# Patient Record
Sex: Female | Born: 1965
Health system: Southern US, Community
[De-identification: ages and names within clinical notes are randomized; demographics above are authoritative.]

## PROBLEM LIST (undated history)

## (undated) DIAGNOSIS — T8859XA Other complications of anesthesia, initial encounter: Secondary | ICD-10-CM

## (undated) DIAGNOSIS — Z5189 Encounter for other specified aftercare: Secondary | ICD-10-CM

## (undated) DIAGNOSIS — D649 Anemia, unspecified: Secondary | ICD-10-CM

## (undated) DIAGNOSIS — M1990 Unspecified osteoarthritis, unspecified site: Secondary | ICD-10-CM

## (undated) DIAGNOSIS — T4145XA Adverse effect of unspecified anesthetic, initial encounter: Secondary | ICD-10-CM

## (undated) DIAGNOSIS — E119 Type 2 diabetes mellitus without complications: Secondary | ICD-10-CM

## (undated) DIAGNOSIS — I10 Essential (primary) hypertension: Secondary | ICD-10-CM

## (undated) HISTORY — DX: Encounter for other specified aftercare: Z51.89

## (undated) HISTORY — DX: Unspecified osteoarthritis, unspecified site: M19.90

## (undated) HISTORY — DX: Essential (primary) hypertension: I10

## (undated) HISTORY — PX: COLONOSCOPY: SHX174

## (undated) HISTORY — DX: Type 2 diabetes mellitus without complications: E11.9

## (undated) HISTORY — PX: TUBAL LIGATION: SHX77

---
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
## 2010-05-31 DIAGNOSIS — Z5189 Encounter for other specified aftercare: Secondary | ICD-10-CM

## 2010-05-31 HISTORY — DX: Encounter for other specified aftercare: Z51.89

## 2010-12-17 ENCOUNTER — Other Ambulatory Visit: Payer: Self-pay

## 2010-12-17 ENCOUNTER — Emergency Department (HOSPITAL_BASED_OUTPATIENT_CLINIC_OR_DEPARTMENT_OTHER)
Admission: EM | Admit: 2010-12-17 | Discharge: 2010-12-17 | Disposition: A | Discharge status: Other Acute Inpatient Hospital | Payer: Self-pay | Attending: Emergency Medicine | Admitting: Emergency Medicine

## 2010-12-17 ENCOUNTER — Encounter: Payer: Self-pay | Admitting: *Deleted

## 2010-12-17 DIAGNOSIS — R404 Transient alteration of awareness: Secondary | ICD-10-CM | POA: Insufficient documentation

## 2010-12-17 DIAGNOSIS — R55 Syncope and collapse: Secondary | ICD-10-CM | POA: Insufficient documentation

## 2010-12-17 DIAGNOSIS — D649 Anemia, unspecified: Secondary | ICD-10-CM | POA: Insufficient documentation

## 2010-12-17 HISTORY — DX: Anemia, unspecified: D64.9

## 2010-12-17 LAB — COMPREHENSIVE METABOLIC PANEL
Alkaline Phosphatase: 51 U/L (ref 39–117)
BUN: 10 mg/dL (ref 6–23)
CO2: 24 mEq/L (ref 19–32)
Chloride: 101 mEq/L (ref 96–112)
GFR calc Af Amer: >60 mL/min (ref >60)
GFR calc non Af Amer: >60 mL/min (ref >60)
Glucose, Bld: 120 mg/dL — ABNORMAL HIGH (ref 70–99)
Potassium: 3.5 mEq/L (ref 3.5–5.1)
Total Bilirubin: 0.2 mg/dL — ABNORMAL LOW (ref 0.3–1.2)

## 2010-12-17 LAB — DIFFERENTIAL
Basophils Relative: 0 % (ref 0–1)
Lymphocytes Relative: 17 % (ref 12–46)
Monocytes Absolute: 0.5 10*3/uL (ref 0.1–1.0)
Monocytes Relative: 7 % (ref 3–12)
Neutro Abs: 4.7 10*3/uL (ref 1.7–7.7)

## 2010-12-17 LAB — LIPASE, BLOOD: Lipase: 36 U/L (ref 11–59)

## 2010-12-17 LAB — CBC
HCT: 21.1 % — ABNORMAL LOW (ref 36.0–46.0)
Hemoglobin: 5.9 g/dL — CL (ref 12.0–15.0)
MCHC: 28 g/dL — ABNORMAL LOW (ref 30.0–36.0)

## 2010-12-17 NOTE — ED Notes (Signed)
Pt admitted to [facility] 767 IV saline lock intact.

## 2010-12-17 NOTE — ED Provider Notes (Signed)
History     Chief Complaint  Patient presents with  . Loss of Consciousness   HPI Comments: Patient states was walking in [place], developed pain in the epigastric area, felt like was going to pass out.  Improving now.  Had just eaten a fish dinner prior to the onset of symptoms.  Patient is a 45 y.o. female presenting with syncope. The history is provided by the patient.  Loss of Consciousness This is a new problem. The current episode started 1 to 2 hours ago. The problem has been rapidly improving. Associated symptoms include abdominal pain. Pertinent negatives include no chest pain and no shortness of breath. The symptoms are aggravated by nothing. The symptoms are relieved by rest. She has tried nothing for the symptoms.    Past Medical History  Diagnosis Date  . Anemia     History reviewed. No pertinent past surgical history.  No family history on file.  History  Substance Use Topics  . Smoking status: Never Smoker   . Smokeless tobacco: Not on file  . Alcohol Use: No    OB History    Grav Para Term Preterm Abortions TAB SAB Ect Mult Living                  Review of Systems  Constitutional: Negative for fever, chills and diaphoresis.  Respiratory: Negative for shortness of breath.   Cardiovascular: Positive for syncope. Negative for chest pain.  Gastrointestinal: Positive for abdominal pain.  Neurological: Positive for dizziness.  All other systems reviewed and are negative.    Physical Exam  BP 119/69  Pulse 64  Temp(Src) 98.5 F (36.9 C) (Oral)  Resp 20  SpO2 100%  Physical Exam  Constitutional: She is oriented to person, place, and time. She appears well-developed and well-nourished. No distress.  HENT:  Head: Normocephalic and atraumatic.  Eyes: EOM are normal. Pupils are equal, round, and reactive to light. Right eye exhibits no discharge.  Neck: Normal range of motion. Neck supple.  Cardiovascular: Normal rate, regular rhythm and normal heart  sounds.   Pulmonary/Chest: Effort normal and breath sounds normal. No respiratory distress. She has no wheezes. She has no rales.  Abdominal: Soft. Bowel sounds are normal. She exhibits no distension. There is no tenderness.  Musculoskeletal: Normal range of motion.  Neurological: She is alert and oriented to person, place, and time.  Skin: Skin is warm and dry. She is not diaphoretic.    ED Course  Procedures  MDM Rectal exam performed.  There was blood from period which skews the results.  Spoke with the hospitalist at [facility].  Will accept in transfer.      Riley Lam [facility] 12/22/10 [phone]

## 2010-12-17 NOTE — ED Notes (Signed)
States she went to [employer] after eating lunch. C.o abd pain sudden onset and passed out. Hx Chronic anemia. No vomiting. Weakness on arrival to triage.

## 2011-09-23 ENCOUNTER — Encounter: Payer: Self-pay | Admitting: Family Medicine

## 2011-09-23 ENCOUNTER — Ambulatory Visit (INDEPENDENT_AMBULATORY_CARE_PROVIDER_SITE_OTHER): Payer: BC Managed Care – PPO | Admitting: Family Medicine

## 2011-09-23 VITALS — BP 110/72 | HR 65 | Temp 98.1°F | Ht 68.5 in | Wt 191.0 lb

## 2011-09-23 DIAGNOSIS — N920 Excessive and frequent menstruation with regular cycle: Secondary | ICD-10-CM | POA: Insufficient documentation

## 2011-09-23 DIAGNOSIS — D500 Iron deficiency anemia secondary to blood loss (chronic): Secondary | ICD-10-CM

## 2011-09-23 LAB — CBC WITH DIFFERENTIAL/PLATELET
Basophils Relative: 0.5 % (ref 0.0–3.0)
Eosinophils Absolute: 0 10*3/uL (ref 0.0–0.7)
Hemoglobin: 8.2 g/dL — ABNORMAL LOW (ref 12.0–15.0)
MCHC: 30.5 g/dL (ref 30.0–36.0)
MCV: 72.4 fl — ABNORMAL LOW (ref 78.0–100.0)
Monocytes Absolute: 0.2 10*3/uL (ref 0.1–1.0)
Neutro Abs: 1.6 10*3/uL (ref 1.4–7.7)
RBC: 3.71 Mil/uL — ABNORMAL LOW (ref 3.87–5.11)

## 2011-09-23 NOTE — Patient Instructions (Signed)
We'll notify you of your lab results We'll call you with your GYN appt to discuss your options Call with any questions or concerns Welcome!  We're glad to have you! Happy Spring!!

## 2011-09-23 NOTE — Progress Notes (Signed)
  Subjective:    Patient ID: Carmen Porter, female    DOB: [birth year], 46 y.o.   MRN: [id number]  HPI New.  Previous MD- [facility].  Anemia- pt reports chronic problem, having 'heavy heavy periods'.  Was checked for fibroids but this was 'negative'.  Reports iron levels are low.  Has had 'fainting spells'.  Has had blood transfusions previously.  Does not want hysterectomy.  Has tried NuvaRing which 'helped'.  + fatigue.  LMP- 4/13.   Review of Systems For ROS see HPI     Objective:   Physical Exam  Vitals reviewed. Constitutional: She is oriented to person, place, and time. She appears well-developed and well-nourished. No distress.  HENT:  Head: Normocephalic and atraumatic.  Eyes: Conjunctivae and EOM are normal. Pupils are equal, round, and reactive to light.  Neck: Normal range of motion. Neck supple. No thyromegaly present.  Cardiovascular: Normal rate, regular rhythm, normal heart sounds and intact distal pulses.   No murmur heard. Pulmonary/Chest: Effort normal and breath sounds normal. No respiratory distress.  Abdominal: Soft. She exhibits no distension. There is no tenderness.  Musculoskeletal: She exhibits no edema.  Lymphadenopathy:    She has no cervical adenopathy.  Neurological: She is alert and oriented to person, place, and time.  Skin: Skin is warm and dry.  Psychiatric: She has a normal mood and affect. Her behavior is normal.          Assessment & Plan:

## 2011-09-26 NOTE — Assessment & Plan Note (Signed)
New.  Refer to GYN for discussion on ablation procedure as pt is not interested in hysterectomy.  Will follow closely.

## 2011-09-26 NOTE — Assessment & Plan Note (Signed)
New to provider.  Chronic for pt.  Check labs to assess current levels.  Refer to GYN for discussion on menorrhagia to prevent additional losses.

## 2011-10-01 MED ORDER — FERROUS SULFATE 325 (65 FE) MG PO TABS: 325.0000 mg | ORAL_TABLET | Freq: Every day | ORAL | Status: DC

## 2011-10-01 NOTE — Progress Notes (Signed)
Addended by: Derry Lory A on: 10/01/2011 05:31 PM   Modules accepted: Orders

## 2012-06-15 ENCOUNTER — Ambulatory Visit: Payer: Self-pay | Admitting: Family Medicine

## 2012-06-15 DIAGNOSIS — Z0489 Encounter for examination and observation for other specified reasons: Secondary | ICD-10-CM

## 2012-10-06 ENCOUNTER — Encounter: Payer: Self-pay | Admitting: Family Medicine

## 2012-10-06 ENCOUNTER — Ambulatory Visit (INDEPENDENT_AMBULATORY_CARE_PROVIDER_SITE_OTHER): Payer: BC Managed Care – PPO | Admitting: Family Medicine

## 2012-10-06 VITALS — BP 130/78 | HR 74 | Temp 98.2°F | Ht 67.5 in | Wt 192.6 lb

## 2012-10-06 DIAGNOSIS — M25532 Pain in left wrist: Secondary | ICD-10-CM | POA: Insufficient documentation

## 2012-10-06 DIAGNOSIS — Z1331 Encounter for screening for depression: Secondary | ICD-10-CM

## 2012-10-06 DIAGNOSIS — M25539 Pain in unspecified wrist: Secondary | ICD-10-CM

## 2012-10-06 DIAGNOSIS — Z0000 Encounter for general adult medical examination without abnormal findings: Secondary | ICD-10-CM | POA: Insufficient documentation

## 2012-10-06 DIAGNOSIS — Z1231 Encounter for screening mammogram for malignant neoplasm of breast: Secondary | ICD-10-CM

## 2012-10-06 DIAGNOSIS — E049 Nontoxic goiter, unspecified: Secondary | ICD-10-CM

## 2012-10-06 DIAGNOSIS — E010 Iodine-deficiency related diffuse (endemic) goiter: Secondary | ICD-10-CM

## 2012-10-06 NOTE — Patient Instructions (Addendum)
We'll notify you of your lab results and make any changes if needed Someone will call you with your mammogram and thyroid ultrasound Follow up w/ GYN as scheduled Take time for yourself!  You deserve it! Call with any questions or concerns Happy Mother's Day!

## 2012-10-06 NOTE — Progress Notes (Signed)
  Subjective:    Patient ID: Marzetta Board, female    DOB: June 04, 1965, 47 y.o.   MRN: [id number]  HPI CPE- last mammo 2 yrs ago.  UTD on GYN- [facility]   Review of Systems Patient reports no vision/ hearing changes, adenopathy,fever, weight change,  persistant/recurrent hoarseness , swallowing issues, chest pain, palpitations, edema, persistant/recurrent cough, hemoptysis, dyspnea (rest/exertional/paroxysmal nocturnal), gastrointestinal bleeding (melena, rectal bleeding), abdominal pain, significant heartburn, bowel changes, GU symptoms (dysuria, hematuria, incontinence), Gyn symptoms (abnormal  bleeding, pain),  syncope, focal weakness, memory loss, numbness & tingling, skin/hair/nail changes, abnormal bruising, anxiety, or depression.   L wrist pain- pt had injury at work 1 month ago and now having sharp pains associated w/ weakness.  Pressure of watch is uncomfortable.  Pain w/ wrist flexion, painful to lift.  Taking ibuprofen w/ some relief.    Objective:   Physical Exam General Appearance:    Alert, cooperative, no distress, appears stated age  Head:    Normocephalic, without obvious abnormality, atraumatic  Eyes:    PERRL, conjunctiva/corneas clear, EOM's intact, fundi    benign, both eyes  Ears:    Normal TM's and external ear canals, both ears  Nose:   Nares normal, septum midline, mucosa normal, no drainage    or sinus tenderness  Throat:   Lips, mucosa, and tongue normal; teeth and gums normal  Neck:   Supple, symmetrical, trachea midline, no adenopathy;    Thyroid: R thyroid enlargement w/ ? nodule  Back:     Symmetric, no curvature, ROM normal, no CVA tenderness  Lungs:     Clear to auscultation bilaterally, respirations unlabored  Chest Wall:    No tenderness or deformity   Heart:    Regular rate and rhythm, S1 and S2 normal, no murmur, rub   or gallop  Breast Exam:    Deferred to GYN  Abdomen:     Soft, non-tender, bowel sounds active all four quadrants,    no masses, no  organomegaly  Genitalia:    Deferred to GYN  Rectal:    Extremities:   Extremities normal, atraumatic, no cyanosis or edema.  L wrist TTP on extensor surface, pain w/ wrist flexion.  Pulses:   2+ and symmetric all extremities  Skin:   Skin color, texture, turgor normal, no rashes or lesions  Lymph nodes:   Cervical, supraclavicular, and axillary nodes normal  Neurologic:   CNII-XII intact, normal strength, sensation and reflexes    throughout          Assessment & Plan:

## 2012-10-07 LAB — BASIC METABOLIC PANEL
CO2: 23 mEq/L (ref 19–32)
Chloride: 107 mEq/L (ref 96–112)
Creat: 0.61 mg/dL (ref 0.50–1.10)
Potassium: 4.1 mEq/L (ref 3.5–5.3)

## 2012-10-07 LAB — HEPATIC FUNCTION PANEL
ALT: 12 U/L (ref 0–35)
AST: 20 U/L (ref 0–37)
Albumin: 4.3 g/dL (ref 3.5–5.2)
Alkaline Phosphatase: 52 U/L (ref 39–117)
Total Bilirubin: 0.2 mg/dL — ABNORMAL LOW (ref 0.3–1.2)
Total Protein: 7.4 g/dL (ref 6.0–8.3)

## 2012-10-07 LAB — LIPID PANEL: Cholesterol: 168 mg/dL (ref 0–200)

## 2012-10-07 LAB — CBC WITH DIFFERENTIAL/PLATELET
Basophils Relative: 0 % (ref 0–1)
Eosinophils Absolute: 0.1 10*3/uL (ref 0.0–0.7)
Eosinophils Relative: 1 % (ref 0–5)
Hemoglobin: 7.8 g/dL — ABNORMAL LOW (ref 12.0–15.0)
MCH: 19.5 pg — ABNORMAL LOW (ref 26.0–34.0)
MCHC: 29.9 g/dL — ABNORMAL LOW (ref 30.0–36.0)
MCV: 65.1 fL — ABNORMAL LOW (ref 78.0–100.0)
Monocytes Absolute: 0.2 10*3/uL (ref 0.1–1.0)
Monocytes Relative: 4 % (ref 3–12)
Neutrophils Relative %: 60 % (ref 43–77)

## 2012-10-07 LAB — TSH: TSH: 1.536 u[IU]/mL (ref 0.350–4.500)

## 2012-10-08 NOTE — Assessment & Plan Note (Signed)
New.  Refer to ortho given duration of pain and limited motion.  Pt expressed understanding and is in agreement w/ plan.

## 2012-10-08 NOTE — Assessment & Plan Note (Signed)
New.  Get US and labs to assess. 

## 2012-10-08 NOTE — Assessment & Plan Note (Signed)
Pt due for mammo.  + thyromegaly on PE.  UTD on pap.  Check labs.  Anticipatory guidance provided.

## 2012-10-10 ENCOUNTER — Telehealth: Payer: Self-pay | Admitting: *Deleted

## 2012-10-10 MED ORDER — FERROUS SULFATE 325 (65 FE) MG PO TABS: 325.0000 mg | ORAL_TABLET | Freq: Every day | ORAL | Status: DC

## 2012-10-10 NOTE — Telephone Encounter (Signed)
Spoke with the pt and informed her of recent lab results and recommendations per Dr. Beverely Low.  Pt understood and agreed.   New rx sent to the pharmacy by e-script.//AB/CMA

## 2012-10-10 NOTE — Telephone Encounter (Signed)
Message copied by Verdie Shire on Tue Oct 10, 2012  4:09 PM ------      Message from: Sheliah Hatch      Created: Sun Oct 08, 2012  9:25 AM       Pt continues to be very anemic- needs to start daily FeSO4 325 daily.  Also should add stool softener ------

## 2012-10-11 LAB — VITAMIN D 1,25 DIHYDROXY: Vitamin D2 1, 25 (OH)2: <8 pg/mL

## 2012-10-13 ENCOUNTER — Ambulatory Visit (HOSPITAL_BASED_OUTPATIENT_CLINIC_OR_DEPARTMENT_OTHER)
Admission: RE | Admit: 2012-10-13 | Discharge: 2012-10-13 | Disposition: A | Discharge status: Home / Self Care | Payer: BC Managed Care – PPO | Source: Ambulatory Visit | Attending: Family Medicine | Admitting: Family Medicine

## 2012-10-13 DIAGNOSIS — E041 Nontoxic single thyroid nodule: Secondary | ICD-10-CM | POA: Insufficient documentation

## 2012-10-13 DIAGNOSIS — E010 Iodine-deficiency related diffuse (endemic) goiter: Secondary | ICD-10-CM

## 2012-11-27 ENCOUNTER — Encounter: Payer: Self-pay | Admitting: Family Medicine

## 2012-12-05 ENCOUNTER — Telehealth: Payer: Self-pay | Admitting: Family Medicine

## 2012-12-05 NOTE — Telephone Encounter (Signed)
In reference to Orthopaedic referral entered 10/08/12, which ended up being workers comp, patient has had no appointment.  The workers comp specialist at [employer] attempted to reach patient multiple times, as did I.  I also mailed patient a letter.  As of today, 12/05/12, patient will not respond.

## 2015-06-26 DIAGNOSIS — H524 Presbyopia: Secondary | ICD-10-CM | POA: Diagnosis not present

## 2015-06-26 DIAGNOSIS — H52223 Regular astigmatism, bilateral: Secondary | ICD-10-CM | POA: Diagnosis not present

## 2015-06-26 DIAGNOSIS — H5213 Myopia, bilateral: Secondary | ICD-10-CM | POA: Diagnosis not present

## 2015-07-24 ENCOUNTER — Encounter (HOSPITAL_BASED_OUTPATIENT_CLINIC_OR_DEPARTMENT_OTHER): Payer: Self-pay | Admitting: *Deleted

## 2015-07-24 ENCOUNTER — Emergency Department (HOSPITAL_BASED_OUTPATIENT_CLINIC_OR_DEPARTMENT_OTHER)
Admission: EM | Admit: 2015-07-24 | Discharge: 2015-07-24 | Disposition: A | Discharge status: Home / Self Care | Payer: Self-pay | Attending: Emergency Medicine | Admitting: Emergency Medicine

## 2015-07-24 DIAGNOSIS — Y9389 Activity, other specified: Secondary | ICD-10-CM | POA: Insufficient documentation

## 2015-07-24 DIAGNOSIS — S60521A Blister (nonthermal) of right hand, initial encounter: Secondary | ICD-10-CM | POA: Insufficient documentation

## 2015-07-24 DIAGNOSIS — Y990 Civilian activity done for income or pay: Secondary | ICD-10-CM | POA: Insufficient documentation

## 2015-07-24 DIAGNOSIS — Y9289 Other specified places as the place of occurrence of the external cause: Secondary | ICD-10-CM | POA: Insufficient documentation

## 2015-07-24 DIAGNOSIS — X12XXXA Contact with other hot fluids, initial encounter: Secondary | ICD-10-CM | POA: Insufficient documentation

## 2015-07-24 DIAGNOSIS — D649 Anemia, unspecified: Secondary | ICD-10-CM | POA: Insufficient documentation

## 2015-07-24 DIAGNOSIS — Z79899 Other long term (current) drug therapy: Secondary | ICD-10-CM | POA: Insufficient documentation

## 2015-07-24 NOTE — Discharge Instructions (Signed)
Blisters °A blister is a fluid-filled sac that forms between layers of skin. Blisters often form in areas where skin rubs against other skin or rubs against something else. The most common areas for blisters are the hands and feet. °CAUSES °A blister can be caused by: °· An injury. °· A burn. °· An allergic reaction. °· An infection. °· Exposure to irritating chemicals. °· Friction. °Friction blisters often result from: °· Sports. °· Repetitive activities. °· Shoes that are too tight or too loose. °SIGNS AND SYMPTOMS °A blister is often round and looks like a bump. It may itch or be painful to the touch. The liquid in a blister is clear or bloody. Before a blister forms, the skin may become red, feel warm, itch, or be painful to the touch. °DIAGNOSIS °A blister can usually be diagnosed from its appearance. °TREATMENT °Treatment involves protecting the area where the blister has formed until the skin has healed. If something is likely to rub against the blister, apply a bandage (dressing) with a hole in the middle over the blister. °Most blisters break open, dry up, and go away on their own within 10 days. Rarely, blisters that are very painful may be drained before they break open on their own. Draining of a blister should only be done by a health care provider under sterile conditions. °HOME CARE INSTRUCTIONS °· Protect the area where the blister has formed as directed by your health care provider. °· Do not open or pop your blister, because it could become infected. °· If the blister is very painful, ask your health care provider whether you should have it drained. °· If the blister breaks open on its own: °¨ Do not remove the loose skin that is over the blister. °¨ Wash the blister area with soap and water every day. °¨ After washing the blister area, you may apply an antibiotic cream or ointment and cover the area with a bandage. °PREVENTION °Taking these steps can help to prevent blisters that are caused by  friction: °· Wear comfortable shoes that fit well. °· Always wear socks with shoes. °· Wear extra socks or use tape, bandages, or pads over blister-prone areas as needed. °· Wear protective gear, such as gloves, when participating in sports or activities that can cause blisters. °· Use powders as needed to keep your feet dry. °SEEK MEDICAL CARE IF: °· You have increased redness, swelling, or pain in the blister area. °· A puslike discharge is coming from the blister area. °· You have a fever. °· You have chills. °  °This information is not intended to replace advice given to you by your health care provider. Make sure you discuss any questions you have with your health care provider. °  °Document Released: 06/24/2004 Document Revised: 06/07/2014 Document Reviewed: 12/15/2013 °Elsevier Interactive Patient Education ©2016 Elsevier Inc. ° °

## 2015-07-24 NOTE — ED Provider Notes (Signed)
CSN: [id number]     Arrival date & time 07/24/15  0846 History   First MD Initiated Contact with Patient 07/24/15 0902     No chief complaint on file.    (Consider location/radiation/quality/duration/timing/severity/associated sxs/prior Treatment) The history is provided by the patient and medical records. No language interpreter was used.     Carmen Porter is a 50 y.o. female  with no pertinent PMH who presents to the Emergency Department complaining of a blister on right hand that has been progressively increasing in size since yesterday morning. Patient spilled coffee on hand while at work yesterday morning. Was seen by employee health at that time and given ABX ointment and instructions not to pop blister. Using ABX ointment as directed. Associated symptoms include throbbing 6/10 pain. Denies numbness, tingling.   Past Medical History  Diagnosis Date  . Anemia    History reviewed. No pertinent past surgical history. Family History  Problem Relation Age of Onset  . Stroke Father   . Hypertension Father   . Cancer Maternal Grandmother    Social History  Substance Use Topics  . Smoking status: Never Smoker   . Smokeless tobacco: None  . Alcohol Use: No   OB History    No data available     Review of Systems  Constitutional: Negative for fever.  Skin: Positive for wound.  Allergic/Immunologic: Negative for immunocompromised state.      Allergies  Review of patient's allergies indicates no known allergies.  Home Medications   Prior to Admission medications   Medication Sig Start Date End Date Taking? Authorizing Provider  ferrous sulfate 325 (65 FE) MG tablet Take 1 tablet (325 mg total) by mouth daily with breakfast. 10/10/12 07/24/15 Yes Midge Minium, MD  ibuprofen (ADVIL,MOTRIN) 200 MG tablet Take 600 mg by mouth as needed. pain    Yes Historical Provider, MD  tranexamic acid (LYSTEDA) 650 MG TABS Take 1 tablet by mouth daily. 09/13/12  Yes Historical  Provider, MD   BP 131/79 mmHg  Pulse 86  Temp(Src) 97.9 F (36.6 C) (Oral)  Resp 16  Ht 5\' 7"  (1.702 m)  Wt 76.204 kg  BMI 26.31 kg/m2  SpO2 100%  LMP 06/29/2015 Physical Exam  Constitutional: She is oriented to person, place, and time. She appears well-developed and well-nourished.  Alert and in no acute distress  HENT:  Head: Normocephalic and atraumatic.  Cardiovascular: Normal rate, regular rhythm and normal heart sounds.  Exam reveals no gallop and no friction rub.   No murmur heard. Pulmonary/Chest: Effort normal and breath sounds normal. No respiratory distress. She has no wheezes. She has no rales.  Musculoskeletal: She exhibits no edema.  Right hand with full ROM. No erythema, or warmth overlaying the joint.The patient has normal sensation and motor function in the median, ulnar, and radial nerve distributions. There is no anatomic snuff box tenderness. 2+ Radial pulse. Good cap refill.   Neurological: She is alert and oriented to person, place, and time.  Skin: Skin is warm and dry.  Intact 2 cm circular blister of right dorsal hand with no surrounding erythema.   Psychiatric: She has a normal mood and affect. Her behavior is normal. Judgment and thought content normal.  Nursing note and vitals reviewed.   ED Course  Procedures (including critical care time) Labs Review Labs Reviewed - No data to display  Imaging Review No results found. I have personally reviewed and evaluated these images and lab results as part of my medical  decision-making.   EKG Interpretation None      MDM   Final diagnoses:  Blister of hand, right, initial encounter   Carmen Porter presents with intact blister of right hand since spilling hot coffee on extremity yesterday morning. Seen by employee health at that time and given ABX ointment and told to keep blister intact. Agree with this assessment. Patient informed that the risks of infection increases if blister is popped, and it  is recommended to keep it intact. Patient given home care instructions in the evident that it pops naturally. States she has plenty of ABX ointment. She has appointment with employee health for follow up Monday. All questions answered.   [facility] Evin Loiseau, PA-C 07/24/15 [id number]  Carmen Arthurs, MD 07/24/15 [phone]

## 2015-07-24 NOTE — ED Notes (Addendum)
Spilled coffee on left hand has large blister on left hand and thumb yesterday am. PT is [employer] employee and was seen at Employee health yesterday.

## 2017-05-31 DIAGNOSIS — D649 Anemia, unspecified: Secondary | ICD-10-CM

## 2017-05-31 HISTORY — DX: Anemia, unspecified: D64.9

## 2017-07-17 ENCOUNTER — Ambulatory Visit (INDEPENDENT_AMBULATORY_CARE_PROVIDER_SITE_OTHER): Payer: Self-pay | Admitting: Family Medicine

## 2017-07-17 DIAGNOSIS — Z111 Encounter for screening for respiratory tuberculosis: Secondary | ICD-10-CM

## 2017-07-17 NOTE — Progress Notes (Signed)
Patient presents for PPD placement for  Denies previous positive TB test  Denies known exposure to TB   Tuberculin skin test applied to left ventral forearm.  Patient informed to return to [facility] in 48-72 hours for PPD read. Vaccine Information Statement provided to patient.

## 2017-07-19 LAB — TB SKIN TEST
Induration: NEGATIVE mm
TB Skin Test: NEGATIVE

## 2018-02-15 ENCOUNTER — Encounter: Payer: Self-pay | Admitting: Nurse Practitioner

## 2018-02-15 ENCOUNTER — Ambulatory Visit (INDEPENDENT_AMBULATORY_CARE_PROVIDER_SITE_OTHER): Payer: 59

## 2018-02-15 ENCOUNTER — Ambulatory Visit: Payer: 59 | Admitting: Nurse Practitioner

## 2018-02-15 VITALS — BP 122/82 | HR 65 | Temp 98.7°F | Ht 67.0 in | Wt 201.0 lb

## 2018-02-15 DIAGNOSIS — G8929 Other chronic pain: Secondary | ICD-10-CM | POA: Diagnosis not present

## 2018-02-15 DIAGNOSIS — M25562 Pain in left knee: Secondary | ICD-10-CM | POA: Diagnosis not present

## 2018-02-15 DIAGNOSIS — R600 Localized edema: Secondary | ICD-10-CM | POA: Diagnosis not present

## 2018-02-15 DIAGNOSIS — M25561 Pain in right knee: Secondary | ICD-10-CM

## 2018-02-15 DIAGNOSIS — M1711 Unilateral primary osteoarthritis, right knee: Secondary | ICD-10-CM | POA: Diagnosis not present

## 2018-02-15 DIAGNOSIS — M7989 Other specified soft tissue disorders: Secondary | ICD-10-CM | POA: Diagnosis not present

## 2018-02-15 MED ORDER — DICLOFENAC SODIUM 2 % TD SOLN: 1.0000 [in_us] | Freq: Two times a day (BID) | TRANSDERMAL | 0 refills | Status: DC | PRN

## 2018-02-15 MED ORDER — MELOXICAM 7.5 MG PO TABS: 7.5000 mg | ORAL_TABLET | Freq: Every day | ORAL | 0 refills | Status: DC

## 2018-02-15 MED FILL — MELOXICAM 7.5 MG TABLET: 7.5 | 30 days supply | Qty: 30 | Fill #0

## 2018-02-15 NOTE — Patient Instructions (Addendum)
Wear thigh high compression during the day and off at night.  X-rays indicates mild osteoarthritis. I think we should hold referral to sports medicine. She would return to lab for blood draw. Need to check for autoimmune disorder and/or gout.  Use meloxicam and pennsaid for pain.

## 2018-02-15 NOTE — Progress Notes (Signed)
Subjective:  Patient ID: Carmen Porter, female    DOB: March 24, 1966  Age: 52 y.o. MRN: [id number]  CC: Establish Care (est care/knee and ankle swelling,this has been going on for 3 mo/knee get stiff when sit down. )   Knee Pain   Incident onset: onset over 55months ago. There was no injury mechanism. The pain is present in the left knee and right knee. The quality of the pain is described as aching and cramping (stiffness). The pain is at a severity of 10/10. The pain is severe. The pain has been fluctuating since onset. Associated symptoms include an inability to bear weight. Pertinent negatives include no loss of motion, loss of sensation, muscle weakness, numbness or tingling. The symptoms are aggravated by movement and weight bearing. She has tried NSAIDs for the symptoms. The treatment provided moderate relief.    knee stiffness and swelling worse with prolong sitting Some improvement with biofreeze gel and ibuprofen AM knee stiffness, takes 30mins to feel better.  previous activity:track, softball, basketball in high school. Unable to exercise now knee and ankle pain and swelling. No Fhx of arthritis or any autoimmune disorder. No injury in past.  Reviewed past Medical, Social and Family history today.  Outpatient Medications Prior to Visit  Medication Sig Dispense Refill  . Multiple Vitamin (MULTIVITAMIN) capsule Take 1 capsule by mouth daily.    . ferrous sulfate 325 (65 FE) MG tablet Take 1 tablet (325 mg total) by mouth daily with breakfast. 30 tablet 5  . tranexamic acid (LYSTEDA) 650 MG TABS Take 1 tablet by mouth daily.    Marland Kitchen ibuprofen (ADVIL,MOTRIN) 200 MG tablet Take 600 mg by mouth as needed. pain      No facility-administered medications prior to visit.     ROS See HPI  Objective:  BP 122/82   Pulse 65   Temp 98.7 F (37.1 C) (Oral)   Ht 5\' 7"  (1.702 m)   Wt 201 lb (91.2 kg)   SpO2 99%   BMI 31.48 kg/m   BP Readings from Last 3 Encounters:  02/15/18  122/82  07/24/15 131/79  10/06/12 130/78    Wt Readings from Last 3 Encounters:  02/15/18 201 lb (91.2 kg)  07/24/15 168 lb (76.2 kg)  10/06/12 192 lb 9.6 oz (87.4 kg)    Physical Exam  Constitutional: She is oriented to person, place, and time. She appears well-developed and well-nourished.  Musculoskeletal: She exhibits edema and tenderness.       Right hip: Normal.       Left hip: Normal.       Right knee: She exhibits swelling and effusion. She exhibits normal range of motion, no erythema and normal patellar mobility. Tenderness found. Medial joint line, lateral joint line and patellar tendon tenderness noted.       Left knee: She exhibits swelling and effusion. She exhibits normal range of motion, no erythema and normal patellar mobility. Tenderness found. Medial joint line, lateral joint line and patellar tendon tenderness noted.       Right ankle: She exhibits swelling. She exhibits normal range of motion and normal pulse. No tenderness. Achilles tendon normal.       Left ankle: She exhibits swelling. She exhibits normal range of motion and normal pulse. No tenderness. Achilles tendon normal.       Right upper leg: Normal.       Left upper leg: Normal.       Right lower leg: She exhibits edema. She exhibits no  tenderness and no bony tenderness.       Left lower leg: She exhibits edema. She exhibits no tenderness and no bony tenderness.  Neurological: She is alert and oriented to person, place, and time.  Skin: Skin is warm and dry. No erythema.    Lab Results  Component Value Date   WBC 4.7 10/06/2012   HGB 7.8 (L) 10/06/2012   HCT 26.1 (L) 10/06/2012   PLT 237 10/06/2012   GLUCOSE 86 10/06/2012   CHOL 168 10/06/2012   TRIG 49 10/06/2012   HDL 60 10/06/2012   LDLCALC 98 10/06/2012   ALT 12 10/06/2012   AST 20 10/06/2012   NA 138 10/06/2012   K 4.1 10/06/2012   CL 107 10/06/2012   CREATININE 0.61 10/06/2012   BUN 10 10/06/2012   CO2 23 10/06/2012   TSH 1.536  10/06/2012   Assessment & Plan:   Carmen Porter was seen today for establish care.  Diagnoses and all orders for this visit:  Chronic pain of both knees -     meloxicam (MOBIC) 7.5 MG tablet; Take 1 tablet (7.5 mg total) by mouth daily. -     Diclofenac Sodium 2 % SOLN; Place 1 inch onto the skin every 12 (twelve) hours as needed. -     Cancel: Ambulatory referral to Sports Medicine -     DG Knee Complete 4 Views Left -     DG Knee Complete 4 Views Right -     Rheumatoid Factor; Future -     Uric acid; Future -     Sedimentation rate; Future -     C-reactive protein; Future -     Basic metabolic panel; Future  Bilateral leg edema -     Compression stockings -     Rheumatoid Factor; Future -     Uric acid; Future -     Sedimentation rate; Future -     C-reactive protein; Future -     Basic metabolic panel; Future -     TSH   I have discontinued Carmen Porter's ibuprofen. I am also having her start on meloxicam and Diclofenac Sodium. Additionally, I am having her maintain her tranexamic acid, ferrous sulfate, and multivitamin.  Meds ordered this encounter  Medications  . meloxicam (MOBIC) 7.5 MG tablet    Sig: Take 1 tablet (7.5 mg total) by mouth daily.    Dispense:  30 tablet    Refill:  0    Order Specific Question:   Supervising Provider    Answer:   Deandrade, CODY [[id number]]  . Diclofenac Sodium 2 % SOLN    Sig: Place 1 inch onto the skin every 12 (twelve) hours as needed.    Dispense:  2 g    Refill:  0    Order Specific Question:   Supervising Provider    Answer:   Tidd, CODY [[id number]]    Follow-up: Return in about 1 week (around 02/22/2018) for CPE (fasting).  Carmen Lacy, NP

## 2018-02-16 ENCOUNTER — Other Ambulatory Visit (INDEPENDENT_AMBULATORY_CARE_PROVIDER_SITE_OTHER): Payer: 59

## 2018-02-16 DIAGNOSIS — R600 Localized edema: Secondary | ICD-10-CM | POA: Diagnosis not present

## 2018-02-16 DIAGNOSIS — M25562 Pain in left knee: Secondary | ICD-10-CM

## 2018-02-16 DIAGNOSIS — M25561 Pain in right knee: Secondary | ICD-10-CM | POA: Diagnosis not present

## 2018-02-16 DIAGNOSIS — G8929 Other chronic pain: Secondary | ICD-10-CM

## 2018-02-16 LAB — BASIC METABOLIC PANEL
BUN: 18 mg/dL (ref 6–23)
CHLORIDE: 105 meq/L (ref 96–112)
CO2: 24 meq/L (ref 19–32)
CREATININE: 0.63 mg/dL (ref 0.40–1.20)
Calcium: 9.5 mg/dL (ref 8.4–10.5)
GFR: 128.33 mL/min (ref >60.00)
Glucose, Bld: 98 mg/dL (ref 70–99)
POTASSIUM: 4.6 meq/L (ref 3.5–5.1)
Sodium: 140 mEq/L (ref 135–145)

## 2018-02-16 LAB — URIC ACID: Uric Acid, Serum: 3.4 mg/dL (ref 2.4–7.0)

## 2018-02-16 LAB — C-REACTIVE PROTEIN: CRP: 0.6 mg/dL (ref 0.5–20.0)

## 2018-02-16 NOTE — Addendum Note (Signed)
Addended by: Lynnea Ferrier on: 02/16/2018 03:15 PM   Modules accepted: Orders

## 2018-02-17 ENCOUNTER — Other Ambulatory Visit (INDEPENDENT_AMBULATORY_CARE_PROVIDER_SITE_OTHER): Payer: 59

## 2018-02-17 ENCOUNTER — Telehealth: Payer: Self-pay

## 2018-02-17 ENCOUNTER — Other Ambulatory Visit: Payer: 59

## 2018-02-17 DIAGNOSIS — R600 Localized edema: Secondary | ICD-10-CM | POA: Diagnosis not present

## 2018-02-17 LAB — SEDIMENTATION RATE: Sed Rate: 95 mm/hr — ABNORMAL HIGH (ref 0–30)

## 2018-02-17 LAB — RHEUMATOID FACTOR: Rhuematoid fact SerPl-aCnc: <14 IU/mL (ref <14)

## 2018-02-17 NOTE — Addendum Note (Signed)
Addended by: Wilfred Lacy L on: 02/17/2018 09:12 AM   Modules accepted: Orders

## 2018-02-17 NOTE — Telephone Encounter (Signed)
Sed rate lab will have to be repeated. Main lab called and said the blood had clotted. I called pt and she will come in today, 02/17/18 to repeat.

## 2018-02-20 ENCOUNTER — Ambulatory Visit: Payer: 59 | Admitting: Family Medicine

## 2018-02-22 ENCOUNTER — Encounter: Payer: 59 | Admitting: Family Medicine

## 2018-02-23 ENCOUNTER — Telehealth: Payer: Self-pay

## 2018-02-23 ENCOUNTER — Ambulatory Visit (INDEPENDENT_AMBULATORY_CARE_PROVIDER_SITE_OTHER): Payer: 59 | Admitting: Nurse Practitioner

## 2018-02-23 ENCOUNTER — Other Ambulatory Visit (HOSPITAL_COMMUNITY)
Admission: RE | Admit: 2018-02-23 | Discharge: 2018-02-23 | Disposition: A | Discharge status: Home / Self Care | Payer: 59 | Source: Ambulatory Visit | Attending: Nurse Practitioner | Admitting: Nurse Practitioner

## 2018-02-23 ENCOUNTER — Encounter: Payer: Self-pay | Admitting: Nurse Practitioner

## 2018-02-23 VITALS — BP 126/80 | HR 63 | Temp 98.2°F | Ht 67.0 in | Wt 202.0 lb

## 2018-02-23 DIAGNOSIS — D259 Leiomyoma of uterus, unspecified: Secondary | ICD-10-CM

## 2018-02-23 DIAGNOSIS — M25561 Pain in right knee: Secondary | ICD-10-CM

## 2018-02-23 DIAGNOSIS — M25562 Pain in left knee: Secondary | ICD-10-CM

## 2018-02-23 DIAGNOSIS — G8929 Other chronic pain: Secondary | ICD-10-CM | POA: Diagnosis not present

## 2018-02-23 DIAGNOSIS — K644 Residual hemorrhoidal skin tags: Secondary | ICD-10-CM | POA: Insufficient documentation

## 2018-02-23 DIAGNOSIS — D500 Iron deficiency anemia secondary to blood loss (chronic): Secondary | ICD-10-CM | POA: Diagnosis not present

## 2018-02-23 DIAGNOSIS — E010 Iodine-deficiency related diffuse (endemic) goiter: Secondary | ICD-10-CM | POA: Diagnosis not present

## 2018-02-23 DIAGNOSIS — M79605 Pain in left leg: Secondary | ICD-10-CM | POA: Diagnosis not present

## 2018-02-23 DIAGNOSIS — Z0001 Encounter for general adult medical examination with abnormal findings: Secondary | ICD-10-CM | POA: Diagnosis not present

## 2018-02-23 DIAGNOSIS — Z1211 Encounter for screening for malignant neoplasm of colon: Secondary | ICD-10-CM | POA: Diagnosis not present

## 2018-02-23 DIAGNOSIS — Z124 Encounter for screening for malignant neoplasm of cervix: Secondary | ICD-10-CM | POA: Diagnosis not present

## 2018-02-23 DIAGNOSIS — Z1231 Encounter for screening mammogram for malignant neoplasm of breast: Secondary | ICD-10-CM

## 2018-02-23 DIAGNOSIS — N921 Excessive and frequent menstruation with irregular cycle: Secondary | ICD-10-CM | POA: Diagnosis not present

## 2018-02-23 DIAGNOSIS — M79604 Pain in right leg: Secondary | ICD-10-CM | POA: Diagnosis not present

## 2018-02-23 DIAGNOSIS — K5900 Constipation, unspecified: Secondary | ICD-10-CM | POA: Insufficient documentation

## 2018-02-23 DIAGNOSIS — Z114 Encounter for screening for human immunodeficiency virus [HIV]: Secondary | ICD-10-CM

## 2018-02-23 DIAGNOSIS — Z1151 Encounter for screening for human papillomavirus (HPV): Secondary | ICD-10-CM | POA: Diagnosis not present

## 2018-02-23 DIAGNOSIS — Z1239 Encounter for other screening for malignant neoplasm of breast: Secondary | ICD-10-CM

## 2018-02-23 LAB — COMPREHENSIVE METABOLIC PANEL
ALT: 14 U/L (ref 0–35)
AST: 17 U/L (ref 0–37)
Albumin: 4.3 g/dL (ref 3.5–5.2)
Alkaline Phosphatase: 72 U/L (ref 39–117)
BUN: 13 mg/dL (ref 6–23)
CALCIUM: 9.5 mg/dL (ref 8.4–10.5)
CHLORIDE: 106 meq/L (ref 96–112)
CO2: 28 meq/L (ref 19–32)
Creatinine, Ser: 0.55 mg/dL (ref 0.40–1.20)
GFR: 150.09 mL/min (ref >60.00)
Glucose, Bld: 97 mg/dL (ref 70–99)
Potassium: 4.2 mEq/L (ref 3.5–5.1)
Sodium: 139 mEq/L (ref 135–145)
Total Bilirubin: 0.3 mg/dL (ref 0.2–1.2)
Total Protein: 7.2 g/dL (ref 6.0–8.3)

## 2018-02-23 LAB — CBC WITH DIFFERENTIAL/PLATELET
BASOS ABS: 0 10*3/uL (ref 0.0–0.1)
Basophils Relative: 1.3 % (ref 0.0–3.0)
EOS PCT: 1.6 % (ref 0.0–5.0)
Eosinophils Absolute: 0.1 10*3/uL (ref 0.0–0.7)
LYMPHS PCT: 45 % (ref 12.0–46.0)
Lymphs Abs: 1.5 10*3/uL (ref 0.7–4.0)
MCHC: 29.6 g/dL — ABNORMAL LOW (ref 30.0–36.0)
MCV: 63.8 fl — AB (ref 78.0–100.0)
MONOS PCT: 7.6 % (ref 3.0–12.0)
Monocytes Absolute: 0.3 10*3/uL (ref 0.1–1.0)
Neutro Abs: 1.5 10*3/uL (ref 1.4–7.7)
Neutrophils Relative %: 44.5 % (ref 43.0–77.0)
Platelets: 312 10*3/uL (ref 150.0–400.0)
RBC: 3.57 Mil/uL — AB (ref 3.87–5.11)
RDW: 19.6 % — ABNORMAL HIGH (ref 11.5–15.5)
WBC: 3.4 10*3/uL — ABNORMAL LOW (ref 4.0–10.5)

## 2018-02-23 LAB — LIPID PANEL
CHOL/HDL RATIO: 3
Cholesterol: 158 mg/dL (ref 0–200)
HDL: 62.3 mg/dL (ref >39.00)
LDL Cholesterol: 88 mg/dL (ref 0–99)
NONHDL: 95.37
Triglycerides: 36 mg/dL (ref 0.0–149.0)
VLDL: 7.2 mg/dL (ref 0.0–40.0)

## 2018-02-23 LAB — T4, FREE: FREE T4: 0.81 ng/dL (ref 0.60–1.60)

## 2018-02-23 LAB — IBC PANEL
Iron: 23 ug/dL — ABNORMAL LOW (ref 42–145)
Saturation Ratios: 4.5 % — ABNORMAL LOW (ref 20.0–50.0)
TRANSFERRIN: 363 mg/dL — AB (ref 212.0–360.0)

## 2018-02-23 LAB — TSH: TSH: 0.98 u[IU]/mL (ref 0.35–4.50)

## 2018-02-23 LAB — T3, FREE: T3, Free: 3.2 pg/mL (ref 2.3–4.2)

## 2018-02-23 MED ORDER — GABAPENTIN 100 MG PO CAPS: 100.0000 mg | ORAL_CAPSULE | Freq: Every day | ORAL | 3 refills | Status: DC

## 2018-02-23 MED ORDER — DOCUSATE SODIUM 100 MG PO CAPS: 100.0000 mg | ORAL_CAPSULE | Freq: Two times a day (BID) | ORAL | 0 refills | Status: DC | PRN

## 2018-02-23 MED FILL — GABAPENTIN 100 MG CAP: 100 | 30 days supply | Qty: 30 | Fill #0

## 2018-02-23 NOTE — Telephone Encounter (Signed)
critical lab value: 6.7 hemoglobin; 22.8 hematocrite. Carmen Porter pt. reported critical lab value to Dr. Ethelene Hal.  -DMG

## 2018-02-23 NOTE — Telephone Encounter (Signed)
Pt is aware and I advised the pt to do to ED, pt request to wait for [facility] advise tomorrow. Pt has been dealing with this for a while now, denied any symptoms, and aware to go to ED if pt has SOB,dizzy,light headed.   [place] please advise.

## 2018-02-23 NOTE — Assessment & Plan Note (Signed)
Thyroid US done 2014: multinodular thyroid, biopsy not indicated.

## 2018-02-23 NOTE — Assessment & Plan Note (Signed)
Consider referral to GYN if persistent severe anemia? Cbc and IBC ordered today PAP with HPV collected today. Referred to GI for colonoscopy

## 2018-02-23 NOTE — Patient Instructions (Addendum)
Start gabapentin at bedtime. Let me know if no improvement in 2weeks. Make appt with physical therapy. Will refer to neurology if no improvement.  You will be contacted to schedule appt for mammogram and colonoscopy.  Make appt for dental exam and eye exam.  Call employee about [employer] records.  CBC and iron panel: indicates severe anemia Please advise patient to take ferrous sulfate TID and colace BID. Rx sent I entered urgent referral to hematology. It was noted in her chart that she has uterine fibroids. I have also entered referral to GYN. She needs to look into iron rich diet and maintain adequate oral hydration. Repeat same ED precautions.  Normal PAP with negative HPV. Negative HIV Normal thyroid, liver and renal function. return to lab in 1week for repeat cbc.  Health Maintenance, Female Adopting a healthy lifestyle and getting preventive care can go a long way to promote health and wellness. Talk with your health care provider about what schedule of regular examinations is right for you. This is a good chance for you to check in with your provider about disease prevention and staying healthy. In between checkups, there are plenty of things you can do on your own. Experts have done a lot of research about which lifestyle changes and preventive measures are most likely to keep you healthy. Ask your health care provider for more information. Weight and diet Eat a healthy diet  Be sure to include plenty of vegetables, fruits, low-fat dairy products, and lean protein.  Do not eat a lot of foods high in solid fats, added sugars, or salt.  Get regular exercise. This is one of the most important things you can do for your health. ? Most adults should exercise for at least 150 minutes each week. The exercise should increase your heart rate and make you sweat (moderate-intensity exercise). ? Most adults should also do strengthening exercises at least twice a week. This is in addition  to the moderate-intensity exercise.  Maintain a healthy weight  Body mass index (BMI) is a measurement that can be used to identify possible weight problems. It estimates body fat based on height and weight. Your health care provider can help determine your BMI and help you achieve or maintain a healthy weight.  For females 70 years of age and older: ? A BMI below 18.5 is considered underweight. ? A BMI of 18.5 to 24.9 is normal. ? A BMI of 25 to 29.9 is considered overweight. ? A BMI of 30 and above is considered obese.  Watch levels of cholesterol and blood lipids  You should start having your blood tested for lipids and cholesterol at 50 years of age, then have this test every 5 years.  You may need to have your cholesterol levels checked more often if: ? Your lipid or cholesterol levels are high. ? You are older than 50 years of age. ? You are at high risk for heart disease.  Cancer screening Lung Cancer  Lung cancer screening is recommended for adults 77-108 years old who are at high risk for lung cancer because of a history of smoking.  A yearly low-dose CT scan of the lungs is recommended for people who: ? Currently smoke. ? Have quit within the past 15 years. ? Have at least a 30-pack-year history of smoking. A pack year is smoking an average of one pack of cigarettes a day for 1 year.  Yearly screening should continue until it has been 15 years since you quit.  Yearly screening should stop if you develop a health problem that would prevent you from having lung cancer treatment.  Breast Cancer  Practice breast self-awareness. This means understanding how your breasts normally appear and feel.  It also means doing regular breast self-exams. Let your health care provider know about any changes, no matter how small.  If you are in your 20s or 30s, you should have a clinical breast exam (CBE) by a health care provider every 1-3 years as part of a regular health exam.  If  you are 68 or older, have a CBE every year. Also consider having a breast X-ray (mammogram) every year.  If you have a family history of breast cancer, talk to your health care provider about genetic screening.  If you are at high risk for breast cancer, talk to your health care provider about having an MRI and a mammogram every year.  Breast cancer gene (BRCA) assessment is recommended for women who have family members with BRCA-related cancers. BRCA-related cancers include: ? Breast. ? Ovarian. ? Tubal. ? Peritoneal cancers.  Results of the assessment will determine the need for genetic counseling and BRCA1 and BRCA2 testing.  Cervical Cancer Your health care provider may recommend that you be screened regularly for cancer of the pelvic organs (ovaries, uterus, and vagina). This screening involves a pelvic examination, including checking for microscopic changes to the surface of your cervix (Pap test). You may be encouraged to have this screening done every 3 years, beginning at age 45.  For women ages 45-65, health care providers may recommend pelvic exams and Pap testing every 3 years, or they may recommend the Pap and pelvic exam, combined with testing for human papilloma virus (HPV), every 5 years. Some types of HPV increase your risk of cervical cancer. Testing for HPV may also be done on women of any age with unclear Pap test results.  Other health care providers may not recommend any screening for nonpregnant women who are considered low risk for pelvic cancer and who do not have symptoms. Ask your health care provider if a screening pelvic exam is right for you.  If you have had past treatment for cervical cancer or a condition that could lead to cancer, you need Pap tests and screening for cancer for at least 20 years after your treatment. If Pap tests have been discontinued, your risk factors (such as having a new sexual partner) need to be reassessed to determine if screening should  resume. Some women have medical problems that increase the chance of getting cervical cancer. In these cases, your health care provider may recommend more frequent screening and Pap tests.  Colorectal Cancer  This type of cancer can be detected and often prevented.  Routine colorectal cancer screening usually begins at 50 years of age and continues through 50 years of age.  Your health care provider may recommend screening at an earlier age if you have risk factors for colon cancer.  Your health care provider may also recommend using home test kits to check for hidden blood in the stool.  A small camera at the end of a tube can be used to examine your colon directly (sigmoidoscopy or colonoscopy). This is done to check for the earliest forms of colorectal cancer.  Routine screening usually begins at age 40.  Direct examination of the colon should be repeated every 5-10 years through 50 years of age. However, you may need to be screened more often if early forms of precancerous  or small growths are found.  Skin Cancer  Check your skin from head to toe regularly.  Tell your health care provider about any new moles or changes in moles, especially if there is a change in a mole's shape or color.  Also tell your health care provider if you have a mole that is larger than the size of a pencil eraser.  Always use sunscreen. Apply sunscreen liberally and repeatedly throughout the day.  Protect yourself by wearing long sleeves, pants, a wide-brimmed hat, and sunglasses whenever you are outside.  Heart disease, diabetes, and high blood pressure  High blood pressure causes heart disease and increases the risk of stroke. High blood pressure is more likely to develop in: ? People who have blood pressure in the high end of the normal range (130-139/85-89 mm Hg). ? People who are overweight or obese. ? People who are African American.  If you are 18-39 years of age, have your blood  pressure checked every 3-5 years. If you are 40 years of age or older, have your blood pressure checked every year. You should have your blood pressure measured twice-once when you are at a hospital or clinic, and once when you are not at a hospital or clinic. Record the average of the two measurements. To check your blood pressure when you are not at a hospital or clinic, you can use: ? An automated blood pressure machine at a pharmacy. ? A home blood pressure monitor.  If you are between 55 years and 79 years old, ask your health care provider if you should take aspirin to prevent strokes.  Have regular diabetes screenings. This involves taking a blood sample to check your fasting blood sugar level. ? If you are at a normal weight and have a low risk for diabetes, have this test once every three years after 50 years of age. ? If you are overweight and have a high risk for diabetes, consider being tested at a younger age or more often. Preventing infection Hepatitis B  If you have a higher risk for hepatitis B, you should be screened for this virus. You are considered at high risk for hepatitis B if: ? You were born in a country where hepatitis B is common. Ask your health care provider which countries are considered high risk. ? Your parents were born in a high-risk country, and you have not been immunized against hepatitis B (hepatitis B vaccine). ? You have HIV or AIDS. ? You use needles to inject street drugs. ? You live with someone who has hepatitis B. ? You have had sex with someone who has hepatitis B. ? You get hemodialysis treatment. ? You take certain medicines for conditions, including cancer, organ transplantation, and autoimmune conditions.  Hepatitis C  Blood testing is recommended for: ? Everyone born from 1945 through 1965. ? Anyone with known risk factors for hepatitis C.  Sexually transmitted infections (STIs)  You should be screened for sexually transmitted  infections (STIs) including gonorrhea and chlamydia if: ? You are sexually active and are younger than 50 years of age. ? You are older than 50 years of age and your health care provider tells you that you are at risk for this type of infection. ? Your sexual activity has changed since you were last screened and you are at an increased risk for chlamydia or gonorrhea. Ask your health care provider if you are at risk.  If you do not have HIV, but are at risk,   risk, it may be recommended that you take a prescription medicine daily to prevent HIV infection. This is called pre-exposure prophylaxis (PrEP). You are considered at risk if: ? You are sexually active and do not regularly use condoms or know the HIV status of your partner(s). ? You take drugs by injection. ? You are sexually active with a partner who has HIV.  Talk with your health care provider about whether you are at high risk of being infected with HIV. If you choose to begin PrEP, you should first be tested for HIV. You should then be tested every 3 months for as long as you are taking PrEP. Pregnancy  If you are premenopausal and you may become pregnant, ask your health care provider about preconception counseling.  If you may become pregnant, take 400 to 800 micrograms (mcg) of folic acid every day.  If you want to prevent pregnancy, talk to your health care provider about birth control (contraception). Osteoporosis and menopause  Osteoporosis is a disease in which the bones lose minerals and strength with aging. This can result in serious bone fractures. Your risk for osteoporosis can be identified using a bone density scan.  If you are 53 years of age or older, or if you are at risk for osteoporosis and fractures, ask your health care provider if you should be screened.  Ask your health care provider whether you should take a calcium or vitamin D supplement to lower your risk for osteoporosis.  Menopause may have certain physical  symptoms and risks.  Hormone replacement therapy may reduce some of these symptoms and risks. Talk to your health care provider about whether hormone replacement therapy is right for you. Follow these instructions at home:  Schedule regular health, dental, and eye exams.  Stay current with your immunizations.  Do not use any tobacco products including cigarettes, chewing tobacco, or electronic cigarettes.  If you are pregnant, do not drink alcohol.  If you are breastfeeding, limit how much and how often you drink alcohol.  Limit alcohol intake to no more than 1 drink per day for nonpregnant women. One drink equals 12 ounces of beer, 5 ounces of wine, or 1 ounces of hard liquor.  Do not use street drugs.  Do not share needles.  Ask your health care provider for help if you need support or information about quitting drugs.  Tell your health care provider if you often feel depressed.  Tell your health care provider if you have ever been abused or do not feel safe at home. This information is not intended to replace advice given to you by your health care provider. Make sure you discuss any questions you have with your health care provider. Document Released: 11/30/2010 Document Revised: 10/23/2015 Document Reviewed: 02/18/2015 Elsevier Interactive Patient Education  2018 [employer].  CBC and iron panel: indicates severe anemia Please advise patient to take ferrous sulfate TID and colace BID. Rx sent I entered urgent referral to hematology. It was noted in her chart that she has uterine fibroids. I have also entered referral to GYN. She needs to look into iron rich diet and maintain adequate oral hydration. Repeat same ED precautions.  Normal PAP with negative HPV. Negative HIV Normal thyroid, liver and renal function.   Iron Deficiency Anemia, Adult Iron-deficiency anemia is when you have a low amount of red blood cells or hemoglobin. This happens because you have too  little iron in your body. Hemoglobin carries oxygen to parts of the body.  Anemia can cause your body to not get enough oxygen. It may or may not cause symptoms. Follow these instructions at home: Medicines  Take over-the-counter and prescription medicines only as told by your doctor. This includes iron pills (supplements) and vitamins.  If you cannot handle taking iron pills by mouth, ask your doctor about getting iron through: ? A vein (intravenously). ? A shot (injection) into a muscle.  Take iron pills when your stomach is empty. If you cannot handle this, take them with food.  Do not drink milk or take antacids at the same time as your iron pills.  To prevent trouble pooping (constipation), eat fiber or take medicine (stool softener) as told by your doctor. Eating and drinking  Talk with your doctor before changing the foods you eat. He or she may tell you to eat foods that have a lot of iron, such as: ? Liver. ? Lowfat (lean) beef. ? Breads and cereals that have iron added to them (fortified breads and cereals). ? Eggs. ? Dried fruit. ? Dark green, leafy vegetables.  Drink enough fluid to keep your pee (urine) clear or pale yellow.  Eat fresh fruits and vegetables that are high in vitamin C. They help your body to use iron. Foods with a lot of vitamin C include: ? Oranges. ? Peppers. ? Tomatoes. ? Mangoes. General instructions  Return to your normal activities as told by your doctor. Ask your doctor what activities are safe for you.  Keep yourself clean, and keep things clean around you (your surroundings). Anemia can make you get sick more easily.  Keep all follow-up visits as told by your doctor. This is important. Contact a doctor if:  You feel sick to your stomach (nauseous).  You throw up (vomit).  You feel weak.  You are sweating for no clear reason.  You have trouble pooping, such as: ? Pooping (having a bowel movement) less than 3 times a  week. ? Straining to poop. ? Having poop that is hard, dry, or larger than normal. ? Feeling full or bloated. ? Pain in the lower belly. ? Not feeling better after pooping. Get help right away if:  You pass out (faint). If this happens, do not drive yourself to the hospital. Call your local emergency services (911 in the [place]  You have chest pain.  You have shortness of breath that: ? Is very bad. ? Gets worse with physical activity.  You have a fast heartbeat.  You get light-headed when getting up from sitting or lying down. This information is not intended to replace advice given to you by your health care provider. Make sure you discuss any questions you have with your health care provider. Document Released: 06/19/2010 Document Revised: 02/04/2016 Document Reviewed: 02/04/2016 Elsevier Interactive Patient Education  2017 [employer].

## 2018-02-23 NOTE — Progress Notes (Signed)
Subjective:    Patient ID: Carmen Porter, female    DOB: [birth year], 52 y.o.   MRN: [id number]  Patient presents today for complete physical and re eval of knee and leg pain.  HPI  Bilateral knee and leg pain: Mild improvement with use of mobic. Sleep interrupted by leg pain.  Reports constipation due to use of ferrous sulfate. No OTC used.  Sexual History (orientation,birth control, marital status, STD): single. Not currently sexually active for last 38yrs, reports heavy menstrual but irregular. Reports hx of uterine fibroids, did not go to GYN and recommended by previous pcp  Depression/Suicide: Depression screen [facility] 2/9 02/15/2018 10/06/2012  Decreased Interest 0 0  Down, Depressed, Hopeless 0 0  PHQ - 2 Score 0 0   No flowsheet data found.  Vision:will schedule  Dental:will schedule  Immunizations: (TDAP, Hep C screen, Pneumovax, Influenza, zoster)  Health Maintenance  Topic Date Due  . HIV Screening  07/11/1982  . Tetanus Vaccine  07/11/1986  . Pap Smear  07/11/1988  . Mammogram  07/11/2017  . Colon Cancer Screening  07/11/2017  . Flu Shot  08/28/2018*  *Topic was postponed. The date shown is not the original due date.   Diet:regular.  Weight:  Wt Readings from Last 3 Encounters:  02/23/18 202 lb (91.6 kg)  02/15/18 201 lb (91.2 kg)  07/24/15 168 lb (76.2 kg)   Exercise:none, has not made appt for PT  Fall Risk: Fall Risk  02/15/2018  Falls in the past year? No   Home Safety:home with 87yrs old daughter  Advanced Directive: Advanced Directives 07/24/2015  Does Patient Have a Medical Advance Directive? No  Would patient like information on creating a medical advance directive? No - patient declined information     Medications and allergies reviewed with patient and updated if appropriate.  Patient Active Problem List   Diagnosis Date Noted  . Pain in both lower extremities 02/23/2018  . Constipation 02/23/2018  . External hemorrhoids without  complication 38/75/6433  . Left wrist pain 10/06/2012  . Thyromegaly 10/06/2012  . Routine general medical examination at a health care facility 10/06/2012  . Iron deficiency anemia due to chronic blood loss 09/23/2011  . Menorrhagia 09/23/2011    Current Outpatient Medications on File Prior to Visit  Medication Sig Dispense Refill  . meloxicam (MOBIC) 7.5 MG tablet Take 1 tablet (7.5 mg total) by mouth daily. 30 tablet 0  . Multiple Vitamin (MULTIVITAMIN) capsule Take 1 capsule by mouth daily.     No current facility-administered medications on file prior to visit.     Past Medical History:  Diagnosis Date  . Anemia     History reviewed. No pertinent surgical history.  Social History   Socioeconomic History  . Marital status: Married    Spouse name: Not on file  . Number of children: 4  . Years of education: Not on file  . Highest education level: Not on file  Occupational History  . Not on file  Social Needs  . Financial resource strain: Not on file  . Food insecurity:    Worry: Not on file    Inability: Not on file  . Transportation needs:    Medical: Not on file    Non-medical: Not on file  Tobacco Use  . Smoking status: Never Smoker  . Smokeless tobacco: Never Used  Substance and Sexual Activity  . Alcohol use: Yes    Comment: social  . Drug use: No  . Sexual activity: Not  Currently  Lifestyle  . Physical activity:    Days per week: Not on file    Minutes per session: Not on file  . Stress: Not on file  Relationships  . Social connections:    Talks on phone: Not on file    Gets together: Not on file    Attends religious service: Not on file    Active member of club or organization: Not on file    Attends meetings of clubs or organizations: Not on file    Relationship status: Not on file  Other Topics Concern  . Not on file  Social History Narrative  . Not on file    Family History  Problem Relation Age of Onset  . Stroke Father   .  Hypertension Father   . Heart failure Father   . Cancer Maternal Grandmother        cervical cancer  . Cancer Maternal Grandfather        prostate cancer  . Glaucoma Maternal Grandfather        Review of Systems  Constitutional: Negative for fever, malaise/fatigue and weight loss.  HENT: Negative for congestion and sore throat.   Eyes:       Negative for visual changes  Respiratory: Negative for cough and shortness of breath.   Cardiovascular: Negative for chest pain, palpitations and leg swelling.  Gastrointestinal: Positive for constipation. Negative for abdominal pain, blood in stool, diarrhea, heartburn and melena.  Genitourinary: Negative for dysuria, frequency and urgency.  Musculoskeletal: Positive for joint pain. Negative for falls and myalgias.  Skin: Negative for rash.  Neurological: Negative for dizziness, sensory change, focal weakness, weakness and headaches.  Endo/Heme/Allergies: Does not bruise/bleed easily.  Psychiatric/Behavioral: Negative for depression, substance abuse and suicidal ideas. The patient is not nervous/anxious.     Objective:   Vitals:   02/23/18 0928  BP: 126/80  Pulse: 63  Temp: 98.2 F (36.8 C)  SpO2: 99%    Body mass index is 31.64 kg/m.   Physical Examination:  Physical Exam  Constitutional: She is oriented to person, place, and time. She appears well-developed and well-nourished. No distress.  HENT:  Right Ear: External ear normal.  Left Ear: External ear normal.  Nose: Nose normal.  Mouth/Throat: No oropharyngeal exudate.  Eyes: Pupils are equal, round, and reactive to light. Conjunctivae and EOM are normal. No scleral icterus.  Neck: Normal range of motion. Neck supple. No thyromegaly present.  Cardiovascular: Normal rate, regular rhythm, normal heart sounds and intact distal pulses.  Pulmonary/Chest: Effort normal and breath sounds normal. Right breast exhibits no mass, no nipple discharge and no skin change. Left breast  exhibits no mass, no nipple discharge and no skin change. Breasts are symmetrical.  Abdominal: Soft. Bowel sounds are normal. She exhibits no distension. There is no tenderness. There is no guarding. Hernia confirmed negative in the right inguinal area and confirmed negative in the left inguinal area.  Genitourinary: Vagina normal. Rectal exam shows external hemorrhoid. Pelvic exam was performed with patient supine. There is no tenderness on the right labia. There is no tenderness on the left labia. Cervix exhibits no motion tenderness, no discharge and no friability. Right adnexum displays no tenderness and no fullness. Left adnexum displays no tenderness and no fullness. No erythema in the vagina. No vaginal discharge found.  Genitourinary Comments: Chaperone present Atrophy vaginal walls  Musculoskeletal: Normal range of motion. She exhibits no edema or tenderness.  Lymphadenopathy:    She has no cervical adenopathy.  No inguinal adenopathy noted on the right or left side.  Neurological: She is alert and oriented to person, place, and time.  Skin: Skin is warm and dry.  Psychiatric: She has a normal mood and affect. Her behavior is normal. Judgment and thought content normal.  Vitals reviewed.   ASSESSMENT and PLAN:  Carmen Porter was seen today for annual exam.  Diagnoses and all orders for this visit:  Encounter for preventative adult health care exam with abnormal findings -     Lipid panel -     Cancel: CBC -     Comprehensive metabolic panel -     Cytology - PAP  Thyromegaly -     TSH -     T3, free -     T4, free  Breast cancer screening -     MM 3D SCREEN BREAST BILATERAL; Future  Colon cancer screening -     Ambulatory referral to Gastroenterology  Encounter for screening for HIV -     HIV Antibody (routine testing w rflx)  Encounter for Papanicolaou smear for cervical cancer screening -     Cytology - PAP  Chronic pain of both knees -     gabapentin (NEURONTIN) 100 MG  capsule; Take 1 capsule (100 mg total) by mouth at bedtime.  Constipation, unspecified constipation type -     Discontinue: docusate sodium (COLACE) 100 MG capsule; Take 1 capsule (100 mg total) by mouth 2 (two) times daily as needed for mild constipation. -     docusate sodium (COLACE) 100 MG capsule; Take 1 capsule (100 mg total) by mouth 2 (two) times daily as needed for mild constipation.  Pain in both lower extremities -     gabapentin (NEURONTIN) 100 MG capsule; Take 1 capsule (100 mg total) by mouth at bedtime.  Iron deficiency anemia due to chronic blood loss -     CBC w/Diff -     IBC panel -     ferrous sulfate 325 (65 FE) MG tablet; Take 1 tablet (325 mg total) by mouth 3 (three) times daily with meals. -     Ambulatory referral to Hematology -     Ambulatory referral to Gynecology -     CBC; Future  Uterine leiomyoma, unspecified location -     Ambulatory referral to Gynecology  Menorrhagia with irregular cycle -     Ambulatory referral to Gynecology   Thyromegaly Thyroid US done 2014: multinodular thyroid, biopsy not indicated.  Iron deficiency anemia due to chronic blood loss Consider referral to GYN if persistent severe anemia? Cbc and IBC ordered today PAP with HPV collected today. Referred to GI for colonoscopy     Follow up: Return in about 2 months (around 04/25/2018) for knee and leg pain.  Wilfred Lacy, NP

## 2018-02-24 ENCOUNTER — Encounter: Payer: Self-pay | Admitting: Nurse Practitioner

## 2018-02-24 LAB — CYTOLOGY - PAP
Diagnosis: NEGATIVE
HPV: NOT DETECTED

## 2018-02-24 LAB — HIV ANTIBODY (ROUTINE TESTING W REFLEX): HIV: NONREACTIVE

## 2018-02-24 MED ORDER — GABAPENTIN 100 MG PO CAPS: 100.0000 mg | ORAL_CAPSULE | Freq: Every day | ORAL | 3 refills | Status: DC

## 2018-02-24 MED ORDER — DOCUSATE SODIUM 100 MG PO CAPS: 100.0000 mg | ORAL_CAPSULE | Freq: Two times a day (BID) | ORAL | 1 refills | Status: DC | PRN

## 2018-02-24 MED ORDER — FERROUS SULFATE 325 (65 FE) MG PO TABS: 325.0000 mg | ORAL_TABLET | Freq: Three times a day (TID) | ORAL | 5 refills | Status: DC

## 2018-02-24 NOTE — Addendum Note (Signed)
Addended byShawnie Pons on: 02/24/2018 02:11 PM   Modules accepted: Orders

## 2018-02-24 NOTE — Telephone Encounter (Signed)
Please advise patient to take ferrous sulfate TID and colace BID. Rx sent I entered urgent referral to hematology. It was noted in her chart that she has uterine fibroids. I have also entered referral to GYN. She needs to look into iron rich diet and maintain adequate oral hydration. Repeat same ED precautions.

## 2018-03-06 ENCOUNTER — Other Ambulatory Visit: Payer: Self-pay

## 2018-03-06 ENCOUNTER — Encounter: Payer: Self-pay | Admitting: Physical Therapy

## 2018-03-06 ENCOUNTER — Ambulatory Visit: Payer: 59 | Attending: Nurse Practitioner | Admitting: Physical Therapy

## 2018-03-06 DIAGNOSIS — M25662 Stiffness of left knee, not elsewhere classified: Secondary | ICD-10-CM | POA: Diagnosis not present

## 2018-03-06 DIAGNOSIS — M25561 Pain in right knee: Secondary | ICD-10-CM | POA: Insufficient documentation

## 2018-03-06 DIAGNOSIS — M6281 Muscle weakness (generalized): Secondary | ICD-10-CM | POA: Diagnosis not present

## 2018-03-06 DIAGNOSIS — R29898 Other symptoms and signs involving the musculoskeletal system: Secondary | ICD-10-CM | POA: Diagnosis not present

## 2018-03-06 DIAGNOSIS — M25562 Pain in left knee: Secondary | ICD-10-CM | POA: Diagnosis not present

## 2018-03-06 DIAGNOSIS — G8929 Other chronic pain: Secondary | ICD-10-CM | POA: Diagnosis not present

## 2018-03-06 NOTE — Therapy (Signed)
[facility] [address]  [place], [place], [address] Phone: [phone]   Fax:  [phone]  Physical Therapy Evaluation  Patient Details  Name: Carmen Porter MRN: [id number] Date of Birth: January 16, 1966 Referring Provider (PT): Flossie Buffy, NP   Encounter Date: 03/06/2018  PT End of Session - 03/06/18 1823    Visit Number  1    Number of Visits  13    Date for PT Re-Evaluation  04/17/18    Authorization Type  [facility]    PT Start Time  6962    PT Stop Time  1656    PT Time Calculation (min)  42 min    Activity Tolerance  Patient tolerated treatment well;Patient limited by pain    Behavior During Therapy  [facility] for tasks assessed/performed       Past Medical History:  Diagnosis Date  . Anemia     History reviewed. No pertinent surgical history.  There were no vitals filed for this visit.   Subjective Assessment - 03/06/18 1617    Subjective  Patient reports B knee pain started about 5 months ago- reports that L knee popped after standing up from sitting a while ago, but reports that she is not sure if this is the causative event. Patient is a Chartered certified accountant at [employer]. Reports B knee swelling, warmth, and pain which fluctuates. Worse with stairs, standing after sitting for prolonged period of time, transferring patients at work. Worse also in the AM with stiffness lasting more than an hour. Better with heat, rubbing, walking. Pain occurs mostly in lateral part of knee or over patella. Patient concerned about RA but MD advised her that this is likely not the case.     Pertinent History  anemia    Limitations  Sitting;Lifting;Standing;Walking;House hold activities    How long can you sit comfortably?  2 hours    How long can you stand comfortably?  30 min    How long can you walk comfortably?  gets better with walking    Diagnostic tests  02/15/18 L knee xray: Mild patellofemoral degenerative changes are noted;  02/15/18 R knee xray: Mild degenerative changes are noted most marked in the lateral joint space    Patient Stated Goals  "i wish my knees would get back to where they used to be"    Currently in Pain?  Yes    Pain Score  4     Pain Location  Knee    Pain Orientation  Left;Lateral    Pain Type  Chronic pain         [facility] PT Assessment - 03/06/18 1632      Assessment   Medical Diagnosis  Chronic pain of B knees    Referring Provider (PT)  Flossie Buffy, NP    Onset Date/Surgical Date  10/04/17    Next MD Visit  04/25/18    Prior Therapy  Yes- for wrist      Precautions   Precautions  None      Restrictions   Weight Bearing Restrictions  No      Balance Screen   Has the patient fallen in the past 6 months  No    Has the patient had a decrease in activity level because of a fear of falling?   No    Is the patient reluctant to leave their home because of a fear of falling?   No  Home Environment   Living Environment  Private residence    Type of Home  --   townhome   Home Access  Level entry    Home Layout  Two level    Alternate Level Stairs-Number of Steps  13    Alternate Level Stairs-Rails  Left      Prior Function   Level of Independence  Independent    Vocation  Full time employment    Manufacturing engineer- lifting, walking, sitting    Leisure  walking; go to daughter's sporting events      Cognition   Overall Cognitive Status  Within Functional Limits for tasks assessed      Observation/Other Assessments   Observations  unable to observe skin integrity of B knees d/t patient wearing tight pants    Focus on Therapeutic Outcomes (FOTO)   Knee: 38 (62% limited, 41% predicted)      Sensation   Light Touch  Appears Intact      Coordination   Gross Motor Movements are Fluid and Coordinated  Yes      Posture/Postural Control   Posture/Postural Control  Postural limitations    Postural Limitations  Posterior pelvic tilt;Rounded Shoulders       ROM / Strength   AROM / PROM / Strength  AROM;PROM;Strength      AROM   AROM Assessment Site  Knee    Right/Left Knee  Right;Left   painful with all motions on L   Right Knee Extension  0    Right Knee Flexion  115    Left Knee Extension  0    Left Knee Flexion  85      PROM   PROM Assessment Site  Knee    Right/Left Knee  Right;Left   painful with all motions on L   Right Knee Extension  -2    Right Knee Flexion  120    Left Knee Extension  -1    Left Knee Flexion  109      Strength   Strength Assessment Site  Hip;Knee;Ankle    Right/Left Hip  Right;Left    Right Hip Flexion  4/5    Right Hip ABduction  4-/5    Right Hip ADduction  4-/5    Left Hip Flexion  4-/5    Left Hip ABduction  4-/5    Left Hip ADduction  4-/5    Right/Left Knee  Right;Left    Right Knee Flexion  4/5    Right Knee Extension  4+/5    Left Knee Flexion  3+/5   pain in L lateral knee   Left Knee Extension  4-/5   pain in L lateral knee   Right/Left Ankle  Right;Left    Right Ankle Dorsiflexion  4+/5    Right Ankle Plantar Flexion  4+/5    Left Ankle Dorsiflexion  4+/5    Left Ankle Plantar Flexion  4+/5      Flexibility   Soft Tissue Assessment /Muscle Length  yes    Hamstrings  B WFL    Quadriceps  B mildly tight      Palpation   Patella mobility  B patellae slightly hypermobile in medial directions    Palpation comment  L lateral knee TTP; B infrapatellar region mildly edematous and TTP      Ambulation/Gait   Gait Pattern  Step-through pattern;Decreased step length - right;Decreased stance time - left;Decreased hip/knee flexion - left;Decreased weight shift to left  Objective measurements completed on examination: See above findings.              PT Education - 03/06/18 1823    Education Details  prognosis, POC, HEP    Person(s) Educated  Patient    Methods  Explanation;Demonstration;Tactile cues;Verbal cues;Handout    Comprehension   Verbalized understanding;Returned demonstration       PT Short Term Goals - 03/06/18 1828      PT SHORT TERM GOAL #1   Title  Patient to be independent with initial HEP.    Time  3    Period  Weeks    Status  New    Target Date  03/27/18        PT Long Term Goals - 03/06/18 1829      PT LONG TERM GOAL #1   Title  Patient to be independent with advanced HEP.    Time  6    Period  Weeks    Status  New    Target Date  04/17/18      PT LONG TERM GOAL #2   Title  Patient to demonstrate [facility] and pain-free B knee AROM/PROM.    Time  6    Period  Weeks    Status  New    Target Date  04/17/18      PT LONG TERM GOAL #3   Title  Patient to demonstrate >=4+/5 strength in B LEs.    Time  6    Period  Weeks    Status  New    Target Date  04/17/18      PT LONG TERM GOAL #4   Title  Patient to report 60% improvement in pain levels when performing STS transfer after prolonged sitting.    Time  6    Period  Weeks    Status  New    Target Date  04/17/18             Plan - 03/06/18 1823    Clinical Impression Statement  Patient is a 53y/o F presenting to O[facility]T with c/o B knee pain, L worse than R lasting for the past 5 months. Pain fluctuates and occurs most often in lateral knee or over patella. Patient also reporting swelling and warmth, and stiffness which lasts more than an hour in the mornings. 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 Patient today with painful and limited knee ROM, significantly decreased LE strength, decreased flexibility, slightly hypermobility in medial glides of B patellae, and gait deviations. Educated on and received handout on gentle strengthening HEP. Patient reported understanding. Would benefit from skilled PT services 2x/week for 6 weeks to address aforementioned impairments.     Clinical Presentation  Stable    Clinical Decision Making   Low    Rehab Potential  Good    PT Frequency  2x / week    PT Duration  6 weeks    PT Treatment/Interventions  ADLs/Self Care Home Management;Cryotherapy;Electrical Stimulation;Iontophoresis 4mg /ml Dexamethasone;Moist Heat;Ultrasound;DME Instruction;Gait training;Stair training;Functional mobility training;Therapeutic activities;Therapeutic exercise;Manual techniques;Patient/family education;Neuromuscular re-education;Balance training;Passive range of motion;Dry needling;Energy conservation;Splinting;Taping;Vasopneumatic Device    PT Next Visit Plan  reassess HEP    Consulted and Agree with Plan of Care  Patient       Patient will benefit from skilled therapeutic intervention in order to improve the following deficits and impairments:  Increased edema, Decreased activity tolerance, Decreased strength,  Pain, Difficulty walking, Decreased range of motion, Improper body mechanics, Postural dysfunction, Impaired flexibility, Hypermobility  Visit Diagnosis: Chronic pain of left knee  Chronic pain of right knee  Stiffness of left knee, not elsewhere classified  Muscle weakness (generalized)  Other symptoms and signs involving the musculoskeletal system     Problem List Patient Active Problem List   Diagnosis Date Noted  . Pain in both lower extremities 02/23/2018  . Constipation 02/23/2018  . External hemorrhoids without complication 94/49/6759  . Left wrist pain 10/06/2012  . Thyromegaly 10/06/2012  . Routine general medical examination at a health care facility 10/06/2012  . Iron deficiency anemia due to chronic blood loss 09/23/2011  . Menorrhagia 09/23/2011    Carmen Porter, PT, DPT 03/06/18 6:32 PM   [facility] [address]  [place], [place], [address] Phone: [phone]   Fax:  [phone]  Name: Carmen Porter MRN: [id number] Date of Birth: [birth year]

## 2018-03-10 ENCOUNTER — Other Ambulatory Visit: Payer: Self-pay | Admitting: Family

## 2018-03-10 ENCOUNTER — Ambulatory Visit: Payer: 59 | Admitting: Physical Therapy

## 2018-03-10 DIAGNOSIS — D649 Anemia, unspecified: Secondary | ICD-10-CM

## 2018-03-10 DIAGNOSIS — D500 Iron deficiency anemia secondary to blood loss (chronic): Secondary | ICD-10-CM

## 2018-03-13 ENCOUNTER — Other Ambulatory Visit: Payer: Self-pay

## 2018-03-13 ENCOUNTER — Inpatient Hospital Stay: Payer: 59 | Attending: Family | Admitting: Family

## 2018-03-13 ENCOUNTER — Inpatient Hospital Stay: Payer: 59

## 2018-03-13 ENCOUNTER — Encounter: Payer: Self-pay | Admitting: Family

## 2018-03-13 ENCOUNTER — Telehealth: Payer: Self-pay | Admitting: Hematology & Oncology

## 2018-03-13 VITALS — BP 125/81 | HR 69 | Temp 98.8°F | Resp 18 | Wt 204.0 lb

## 2018-03-13 VITALS — BP 132/76 | HR 69 | Temp 98.8°F | Resp 18

## 2018-03-13 DIAGNOSIS — Z79899 Other long term (current) drug therapy: Secondary | ICD-10-CM | POA: Diagnosis not present

## 2018-03-13 DIAGNOSIS — D500 Iron deficiency anemia secondary to blood loss (chronic): Secondary | ICD-10-CM | POA: Diagnosis not present

## 2018-03-13 DIAGNOSIS — R531 Weakness: Secondary | ICD-10-CM | POA: Insufficient documentation

## 2018-03-13 DIAGNOSIS — R42 Dizziness and giddiness: Secondary | ICD-10-CM | POA: Insufficient documentation

## 2018-03-13 DIAGNOSIS — K5900 Constipation, unspecified: Secondary | ICD-10-CM | POA: Diagnosis not present

## 2018-03-13 DIAGNOSIS — K649 Unspecified hemorrhoids: Secondary | ICD-10-CM | POA: Diagnosis not present

## 2018-03-13 DIAGNOSIS — Z808 Family history of malignant neoplasm of other organs or systems: Secondary | ICD-10-CM | POA: Insufficient documentation

## 2018-03-13 DIAGNOSIS — N921 Excessive and frequent menstruation with irregular cycle: Secondary | ICD-10-CM

## 2018-03-13 DIAGNOSIS — Z8042 Family history of malignant neoplasm of prostate: Secondary | ICD-10-CM | POA: Diagnosis not present

## 2018-03-13 DIAGNOSIS — R5383 Other fatigue: Secondary | ICD-10-CM | POA: Diagnosis not present

## 2018-03-13 DIAGNOSIS — D649 Anemia, unspecified: Secondary | ICD-10-CM

## 2018-03-13 LAB — RETICULOCYTES
Immature Retic Fract: 14.5 % (ref 2.3–15.9)
RBC.: 3.74 MIL/uL — AB (ref 3.87–5.11)
RETIC CT PCT: 1.4 % (ref 0.4–3.1)
Retic Count, Absolute: 51.2 10*3/uL (ref 19.0–186.0)

## 2018-03-13 LAB — CBC WITH DIFFERENTIAL (CANCER CENTER ONLY)
BASOS PCT: 1 %
Basophils Absolute: 0 10*3/uL (ref 0.0–0.1)
EOS ABS: 0.1 10*3/uL (ref 0.0–0.5)
Eosinophils Relative: 2 %
HCT: 27.3 % — ABNORMAL LOW (ref 36.0–46.0)
Hemoglobin: 7.3 g/dL — ABNORMAL LOW (ref 12.0–15.0)
Lymphocytes Relative: 42 %
Lymphs Abs: 1.7 10*3/uL (ref 0.7–4.0)
MCH: 19.5 pg — ABNORMAL LOW (ref 26.0–34.0)
MCHC: 26.7 g/dL — ABNORMAL LOW (ref 30.0–36.0)
MCV: 73 fL — ABNORMAL LOW (ref 80.0–100.0)
MONO ABS: 0.3 10*3/uL (ref 0.1–1.0)
MONOS PCT: 7 %
Neutro Abs: 2 10*3/uL (ref 1.7–7.7)
Neutrophils Relative %: 48 %
PLATELETS: 247 10*3/uL (ref 150–400)
RBC: 3.74 MIL/uL — AB (ref 3.87–5.11)
RDW: 23.5 % — AB (ref 11.5–15.5)
WBC: 4.1 10*3/uL (ref 4.0–10.5)
nRBC: 0 % (ref 0.0–0.2)

## 2018-03-13 LAB — CMP (CANCER CENTER ONLY)
ALK PHOS: 84 U/L (ref 38–126)
ALT: 19 U/L (ref 0–44)
AST: 24 U/L (ref 15–41)
Albumin: 4.1 g/dL (ref 3.5–5.0)
Anion gap: 11 (ref 5–15)
BILIRUBIN TOTAL: 0.4 mg/dL (ref 0.3–1.2)
BUN: 10 mg/dL (ref 6–20)
CALCIUM: 9.9 mg/dL (ref 8.9–10.3)
CO2: 24 mmol/L (ref 22–32)
CREATININE: 0.7 mg/dL (ref 0.44–1.00)
Chloride: 105 mmol/L (ref 98–111)
Glucose, Bld: 90 mg/dL (ref 70–99)
Potassium: 4.1 mmol/L (ref 3.5–5.1)
Sodium: 140 mmol/L (ref 135–145)
TOTAL PROTEIN: 7.8 g/dL (ref 6.5–8.1)

## 2018-03-13 LAB — LACTATE DEHYDROGENASE: LDH: 253 U/L — AB (ref 98–192)

## 2018-03-13 LAB — SAVE SMEAR(SSMR), FOR PROVIDER SLIDE REVIEW

## 2018-03-13 MED ORDER — SODIUM CHLORIDE 0.9 % IV SOLN
510.0000 mg | Freq: Once | INTRAVENOUS | Status: AC
  Administered 2018-03-13: 510 mg via INTRAVENOUS
  Filled 2018-03-13: qty 17

## 2018-03-13 MED ORDER — FOLIC ACID 1 MG PO TABS: 1.0000 mg | ORAL_TABLET | Freq: Every day | ORAL | 11 refills | Status: DC

## 2018-03-13 MED ORDER — SODIUM CHLORIDE 0.9 % IV SOLN
Freq: Once | INTRAVENOUS | Status: AC
  Administered 2018-03-13: 13:00:00 via INTRAVENOUS
  Filled 2018-03-13: qty 250

## 2018-03-13 MED FILL — FOLIC ACID 1 MG TABS: 1 | 30 days supply | Qty: 30 | Fill #0

## 2018-03-13 NOTE — Patient Instructions (Signed)

## 2018-03-13 NOTE — Telephone Encounter (Signed)
No sch msg at the time pt presented to checkout 10/14

## 2018-03-13 NOTE — Progress Notes (Signed)
Hematology/Oncology Consultation   Name: Carmen Porter      MRN: [id number]    Location: Room/bed info not found  Date: 03/14/2018 Time:7:54 AM   REFERRING PHYSICIAN: Wilfred Lacy, NP  REASON FOR CONSULT: Iron deficiency anemia due to chronic blood loss    DIAGNOSIS: Iron deficiency anemia secondary to heavy cycles   HISTORY OF PRESENT ILLNESS: Carmen Porter is a very pleasant 52 yo African American female with a long history of iron deficiency anemia.  She states that she is perimenopausal and that her cycle is now irregular and heavy with lots of clots.  Her iron saturation last in September was 4.5% despite taking oral iron daily. Hgb today is 7.3, MCV 73.  The iron supplement has caused constipation and bleeding from her hemorrhoids.  She has tried and failed both the IUD and oral contraception. She plans to contact her gynecologist to discuss having an ablation. She would like to avoid having a hysterectomy if possible.  She states that she has history of uterine fibroids.  She is symptomatic with fatigue, weakness, chills, chewing ice, occasional dizziness as well as intermittent numbness and tingling in her hands and feet.  She has 4 children and history of 1 abortion. No miscarriages.  She had iron deficiency with syncope with her pregnancies and with her first 2 pregnancies was on bedrest for the last 2 months.  No personal or familial history of sickle cell disease or trait.  No family history of anemia that she is aware of.  No personal history of cancer. Family history of cancer includes: maternal grandmother - cervical and maternal aunt with breast.  She has had a tubal ligation as well as her wisdom teeth removed without any complications.  No fever, n/v, cough, rash, SOB, chest pain, palpitations, abdominal pain or changes in bowel or bladder habits.  The puffiness in her feet and ankles comes and goes.  She has a good appetite and is staying well hydrated. Her weight is  stable.  She has never been a smoker and does not drink alcohol.  She works as a Chartered certified accountant at [employer] and is hoping to retire soon and spend time with her children.  She is scheduled for both her mammogram and her first colonoscopy in November.   ROS: All other 10 point review of systems is negative.   PAST MEDICAL HISTORY:   Past Medical History:  Diagnosis Date  . Anemia     ALLERGIES: No Known Allergies    MEDICATIONS:  Current Outpatient Medications on File Prior to Visit  Medication Sig Dispense Refill  . docusate sodium (COLACE) 100 MG capsule Take 1 capsule (100 mg total) by mouth 2 (two) times daily as needed for mild constipation. 180 capsule 1  . ferrous sulfate 325 (65 FE) MG tablet Take 1 tablet (325 mg total) by mouth 3 (three) times daily with meals. 90 tablet 5  . gabapentin (NEURONTIN) 100 MG capsule Take 1 capsule (100 mg total) by mouth at bedtime. 30 capsule 3  . meloxicam (MOBIC) 7.5 MG tablet Take 1 tablet (7.5 mg total) by mouth daily. 30 tablet 0  . Multiple Vitamin (MULTIVITAMIN) capsule Take 1 capsule by mouth daily.     No current facility-administered medications on file prior to visit.      PAST SURGICAL HISTORY History reviewed. No pertinent surgical history.  FAMILY HISTORY: Family History  Problem Relation Age of Onset  . Stroke Father   . Hypertension Father   .  Heart failure Father   . Cancer Maternal Grandmother        cervical cancer  . Cancer Maternal Grandfather        prostate cancer  . Glaucoma Maternal Grandfather     SOCIAL HISTORY:  reports that she has never smoked. She has never used smokeless tobacco. She reports that she drinks alcohol. She reports that she does not use drugs.  PERFORMANCE STATUS: The patient's performance status is 1 - Symptomatic but completely ambulatory  PHYSICAL EXAM: Most Recent Vital Signs: Blood pressure 125/81, pulse 69, temperature 98.8 F (37.1 C), temperature source Oral, resp. rate 18,  weight 204 lb (92.5 kg), SpO2 100 %. BP 125/81 (BP Location: Right Arm, Patient Position: Sitting)   Pulse 69   Temp 98.8 F (37.1 C) (Oral)   Resp 18   Wt 204 lb (92.5 kg)   LMP  (LMP Unknown)   SpO2 100%   BMI 31.95 kg/m   General Appearance:    Alert, cooperative, no distress, appears stated age  Head:    Normocephalic, without obvious abnormality, atraumatic  Eyes:    PERRL, conjunctiva/corneas clear, EOM's intact, fundi    benign, both eyes        Throat:   Lips, mucosa, and tongue normal; teeth and gums normal  Neck:   Supple, symmetrical, trachea midline, no adenopathy;    thyroid:  no enlargement/tenderness/nodules; no carotid   bruit or JVD  Back:     Symmetric, no curvature, ROM normal, no CVA tenderness  Lungs:     Clear to auscultation bilaterally, respirations unlabored  Chest Wall:    No tenderness or deformity   Heart:    Regular rate and rhythm, S1 and S2 normal, no murmur, rub   or gallop     Abdomen:     Soft, non-tender, bowel sounds active all four quadrants,    no masses, no organomegaly        Extremities:   Extremities normal, atraumatic, no cyanosis or edema  Pulses:   2+ and symmetric all extremities  Skin:   Skin color, texture, turgor normal, no rashes or lesions  Lymph nodes:   Cervical, supraclavicular, and axillary nodes normal  Neurologic:   CNII-XII intact, normal strength, sensation and reflexes    throughout    LABORATORY DATA:  Results for orders placed or performed in visit on 03/13/18 (from the past 48 hour(s))  CBC with Differential (Cancer Center Only)     Status: Abnormal   Collection Time: 03/13/18 11:17 AM  Result Value Ref Range   WBC Count 4.1 4.0 - 10.5 K/uL   RBC 3.74 (L) 3.87 - 5.11 MIL/uL   Hemoglobin 7.3 (L) 12.0 - 15.0 g/dL    Comment: Reticulocyte Hemoglobin testing may be clinically indicated, consider ordering this additional test [id number]    HCT 27.3 (L) 36.0 - 46.0 %   MCV 73.0 (L) 80.0 - 100.0 fL   MCH 19.5  (L) 26.0 - 34.0 pg   MCHC 26.7 (L) 30.0 - 36.0 g/dL   RDW 23.5 (H) 11.5 - 15.5 %   Platelet Count 247 150 - 400 K/uL   nRBC 0.0 0.0 - 0.2 %   Neutrophils Relative % 48 %   Neutro Abs 2.0 1.7 - 7.7 K/uL   Lymphocytes Relative 42 %   Lymphs Abs 1.7 0.7 - 4.0 K/uL   Monocytes Relative 7 %   Monocytes Absolute 0.3 0.1 - 1.0 K/uL   Eosinophils Relative 2 %  Eosinophils Absolute 0.1 0.0 - 0.5 K/uL   Basophils Relative 1 %   Basophils Absolute 0.0 0.0 - 0.1 K/uL    Comment: Performed at [facility] at [facility], [address] [place], [place] [address]  Reticulocytes     Status: Abnormal   Collection Time: 03/13/18 11:17 AM  Result Value Ref Range   Retic Ct Pct 1.4 0.4 - 3.1 %   RBC. 3.74 (L) 3.87 - 5.11 MIL/uL   Retic Count, Absolute 51.2 19.0 - 186.0 K/uL   Immature Retic Fract 14.5 2.3 - 15.9 %    Comment: Performed at [facility] at [facility], [address], [place], [place] [address]  Save Smear [facility])     Status: None   Collection Time: 03/13/18 11:17 AM  Result Value Ref Range   Smear Review SMEAR STAINED AND AVAILABLE FOR REVIEW     Comment: Performed at [facility] at [facility], [address] [place], [place] [address]  CMP ([facility] only)     Status: None   Collection Time: 03/13/18 11:17 AM  Result Value Ref Range   Sodium 140 135 - 145 mmol/L   Potassium 4.1 3.5 - 5.1 mmol/L   Chloride 105 98 - 111 mmol/L   CO2 24 22 - 32 mmol/L   Glucose, Bld 90 70 - 99 mg/dL   BUN 10 6 - 20 mg/dL   Creatinine 0.70 0.44 - 1.00 mg/dL   Calcium 9.9 8.9 - 10.3 mg/dL   Total Protein 7.8 6.5 - 8.1 g/dL   Albumin 4.1 3.5 - 5.0 g/dL   AST 24 15 - 41 U/L   ALT 19 0 - 44 U/L   Alkaline Phosphatase 84 38 - 126 U/L   Total Bilirubin 0.4 0.3 - 1.2 mg/dL   GFR, Est Non Af Am >60 >60 mL/min   GFR, Est AFR Am >60 >60 mL/min    Comment: (NOTE) The eGFR has been calculated using the CKD EPI  equation. This calculation has not been validated in all clinical situations. eGFR's persistently <60 mL/min signify possible Chronic Kidney Disease.    Anion gap 11 5 - 15    Comment: Performed at [facility], [address] [place], [place] [address]  Erythropoietin     Status: Abnormal   Collection Time: 03/13/18 11:17 AM  Result Value Ref Range   Erythropoietin 77.8 (H) 2.6 - 18.5 mIU/mL    Comment: (NOTE) [employer] UniCel DxI 800 Immunoassay System Values obtained with different assay methods or kits cannot be used interchangeably. Results cannot be interpreted as absolute evidence of the presence or absence of malignant disease. Performed At: [facility] [place], [place] [id number] Rush Farmer MD OX:[id number]   Lactate dehydrogenase (LDH)     Status: Abnormal   Collection Time: 03/13/18 11:17 AM  Result Value Ref Range   LDH 253 (H) 98 - 192 U/L    Comment: Performed at [facility], [place] [address], [place], [place] [address]      PATHOLOGY: None  ASSESSMENT/PLAN: Carmen Porter is a very pleasant 52 yo African American female with a long history of iron deficiency anemia secondary to heavy cycles despite taking an oral iron supplement daily. She is symptomatic with the iron saturation of 4.5% as mentioned above.  We will give her IV iron today as planned.  She will  start taking folic acid 1 mg PO daily.  She will also be following up with her gynecologist to discuss possible ablation.   We will plan to see her back in another 6 weeks for follow-up and repeat another infusion if needed.  All questions were answered and she is in agreement with the plan. She will contact our office with any questions or concerns. We can certainly see her sooner if need be.  She was discussed with and also seen by Dr. Maylon Peppers and he is in agreement with the aforementioned.   Tish Men     Addendum:  I was  present during interview of Carmen Porter.  She reports irregular menorrhagia for the past 2 to 3 years, and has tried OCPs and IUD without improvement.  She is undergoing evaluation with her gynecologist for uterine artery embolization versus hysterectomy.  She reports significant fatigue associated with her anemia.  Peripheral blood smear was reviewed, which showed microcytic hypochromic RBCs, consistent with severe iron deficiency.  The WBC morphology was normal and there was no blast.  The platelet morphology was normal and there was no platelet clumping.  Given the symptomatic anemia secondary to iron deficiency due to chronic blood loss, we discussed with the patient regarding benefits and risks of IV iron transfusion.  Patient expressed understanding and agree with the plan to proceed with iron transfusion today.  We will see her back in 6 to 8 weeks to assess hematopoietic response to iron transfusion.  We also discussed with the patient about age-appropriate cancer screening, including mammogram and colonoscopy, which have been scheduled in November 2019.

## 2018-03-14 LAB — IRON AND TIBC
Iron: 27 ug/dL — ABNORMAL LOW (ref 41–142)
SATURATION RATIOS: 6 % — AB (ref 21–57)
TIBC: 425 ug/dL (ref 236–444)
UIBC: 398 ug/dL

## 2018-03-14 LAB — FERRITIN: Ferritin: 13 ng/mL (ref 11–307)

## 2018-03-14 LAB — ERYTHROPOIETIN: Erythropoietin: 77.8 m[IU]/mL — ABNORMAL HIGH (ref 2.6–18.5)

## 2018-03-16 ENCOUNTER — Encounter: Payer: 59 | Admitting: Physical Therapy

## 2018-03-24 ENCOUNTER — Telehealth: Payer: Self-pay | Admitting: *Deleted

## 2018-03-24 NOTE — Telephone Encounter (Signed)
Spoke with patient regarding concern for correct work up prior to colonoscopy. Appointment made with extender for Tuesday, 03/28/18 at 315.(dr. Beavers without appointment prior to scheduled procedure)

## 2018-03-24 NOTE — Telephone Encounter (Signed)
Office visit prior to endoscopy would be beneficial to insure that the most appropriate testing is pursued under the safest circumstances. Thank you.

## 2018-03-24 NOTE — Telephone Encounter (Signed)
Dr, Tarri Glenn, This patient is scheduled for a previsit on 04/03/18 with Colonoscopy scheduled on Monday, 04/17/18. Referral is for a screening colonoscopy. On chart review this patient is actively being treated for Fe def anemia. Patient's last recorded, 03/13/18,  Hgb was 7.3 and Hct 27.3. Patient did receive an Iron infusion on 10/14. Does this patient need an office visit prior to procedure date or is it ok to proceed with scheduled procedure. Margie Ege

## 2018-03-24 NOTE — Telephone Encounter (Signed)
Called patient and arranged an office appointment with Ms Carmen Porter, as Dr Tarri Glenn does not have an appointment until after the scheduled Colonoscopy.

## 2018-03-28 ENCOUNTER — Encounter (INDEPENDENT_AMBULATORY_CARE_PROVIDER_SITE_OTHER): Payer: Self-pay

## 2018-03-28 ENCOUNTER — Ambulatory Visit: Payer: 59 | Admitting: Physician Assistant

## 2018-03-28 ENCOUNTER — Encounter: Payer: Self-pay | Admitting: Physician Assistant

## 2018-03-28 VITALS — BP 126/86 | HR 59 | Ht 68.0 in | Wt 210.0 lb

## 2018-03-28 DIAGNOSIS — Z1211 Encounter for screening for malignant neoplasm of colon: Secondary | ICD-10-CM

## 2018-03-28 DIAGNOSIS — D509 Iron deficiency anemia, unspecified: Secondary | ICD-10-CM | POA: Diagnosis not present

## 2018-03-28 NOTE — Progress Notes (Signed)
Chief Complaint: IDA  HPI:    Carmen Porter is a 52 year old AA female, who is been referred to Dr. Tarri Glenn, with a past medical history as listed below, who was referred to me by Nche, Charlene Brooke, NP for a complaint of iron deficiency anemia.      Patient follows with oncology in regards to iron deficiency anemia due to chronic blood loss from menorrhagia.  Patient recently had an iron infusion 03/13/2018.  She was sent to our clinic for a screening colonoscopy.    03/13/2018 iron studies show an iron of 27, saturation low at 6%.  Ferritin normal at 13.  CBC with a hemoglobin of 7.3.    Today, the patient tells me that she is feeling completely well.  She has no GI complaints at all.  She has been dealing with iron deficiency anemia for "years now" related to her heavy menstrual cycles.  Patient tells me that she is now following with oncology who tell her that she may need two or even 3 iron infusions.    Patient's family history is positive for colon cancer in her grandfather in his 25s.    Denies fever, chills, weight loss, anorexia, nausea, vomiting, heartburn, reflux, abdominal pain or change in bowel habits.      Past Medical History:  Diagnosis Date  . Anemia     No past surgical history on file.  Current Outpatient Medications  Medication Sig Dispense Refill  . docusate sodium (COLACE) 100 MG capsule Take 1 capsule (100 mg total) by mouth 2 (two) times daily as needed for mild constipation. 180 capsule 1  . ferrous sulfate 325 (65 FE) MG tablet Take 1 tablet (325 mg total) by mouth 3 (three) times daily with meals. 90 tablet 5  . folic acid (FOLVITE) 1 MG tablet Take 1 tablet (1 mg total) by mouth daily. 30 tablet 11  . gabapentin (NEURONTIN) 100 MG capsule Take 1 capsule (100 mg total) by mouth at bedtime. 30 capsule 3  . meloxicam (MOBIC) 7.5 MG tablet Take 1 tablet (7.5 mg total) by mouth daily. 30 tablet 0  . Multiple Vitamin (MULTIVITAMIN) capsule Take 1 capsule by mouth  daily.     No current facility-administered medications for this visit.     Allergies as of 03/28/2018  . (No Known Allergies)    Family History  Problem Relation Age of Onset  . Stroke Father   . Hypertension Father   . Heart failure Father   . Cancer Maternal Grandmother        cervical cancer  . Cancer Maternal Grandfather        prostate cancer  . Glaucoma Maternal Grandfather     Social History   Socioeconomic History  . Marital status: Married    Spouse name: Not on file  . Number of children: 4  . Years of education: Not on file  . Highest education level: Not on file  Occupational History  . Not on file  Social Needs  . Financial resource strain: Not on file  . Food insecurity:    Worry: Not on file    Inability: Not on file  . Transportation needs:    Medical: Not on file    Non-medical: Not on file  Tobacco Use  . Smoking status: Never Smoker  . Smokeless tobacco: Never Used  Substance and Sexual Activity  . Alcohol use: Yes    Comment: social  . Drug use: No  . Sexual activity: Not  Currently  Lifestyle  . Physical activity:    Days per week: Not on file    Minutes per session: Not on file  . Stress: Not on file  Relationships  . Social connections:    Talks on phone: Not on file    Gets together: Not on file    Attends religious service: Not on file    Active member of club or organization: Not on file    Attends meetings of clubs or organizations: Not on file    Relationship status: Not on file  . Intimate partner violence:    Fear of current or ex partner: Not on file    Emotionally abused: Not on file    Physically abused: Not on file    Forced sexual activity: Not on file  Other Topics Concern  . Not on file  Social History Narrative  . Not on file    Review of Systems:    Constitutional: No weight loss, fever or chills Skin: No rash  Cardiovascular: No chest pain  Respiratory: No SOB  Gastrointestinal: See HPI and otherwise  negative Genitourinary: No dysuria  Neurological: No headache, dizziness or syncope Musculoskeletal: No new muscle or joint pain Hematologic: No bleeding  Psychiatric: No history of depression or anxiety   Physical Exam:  Vital signs: BP 126/86   Pulse (!) 59   Ht 5\' 8"  (1.727 m)   Wt 210 lb (95.3 kg)   LMP 01/02/2018 (Approximate)   BMI 31.93 kg/m  Constitutional:   Pleasant AA female appears to be in NAD, Well developed, Well nourished, alert and cooperative Head:  Normocephalic and atraumatic. Eyes:   PEERL, EOMI. No icterus. Conjunctiva pink. Ears:  Normal auditory acuity. Neck:  Supple Throat: Oral cavity and pharynx without inflammation, swelling or lesion.  Respiratory: Respirations even and unlabored. Lungs clear to auscultation bilaterally.   No wheezes, crackles, or rhonchi.  Cardiovascular: Normal S1, S2. No MRG. Regular rate and rhythm. No peripheral edema, cyanosis or pallor.  Gastrointestinal:  Soft, nondistended, nontender. No rebound or guarding. Normal bowel sounds. No appreciable masses or hepatomegaly. Rectal:  Not performed.  Msk:  Symmetrical without gross deformities. Without edema, no deformity or joint abnormality.  Neurologic:  Alert and  oriented x4;  grossly normal neurologically.  Skin:   Dry and intact without significant lesions or rashes. Psychiatric: Demonstrates good judgement and reason without abnormal affect or behaviors.  RELEVANT LABS AND IMAGING: CBC    Component Value Date/Time   WBC 4.1 03/13/2018 1117   WBC 3.4 (L) 02/23/2018 1018   RBC 3.74 (L) 03/13/2018 1117   RBC 3.74 (L) 03/13/2018 1117   HGB 7.3 (L) 03/13/2018 1117   HCT 27.3 (L) 03/13/2018 1117   PLT 247 03/13/2018 1117   MCV 73.0 (L) 03/13/2018 1117   MCH 19.5 (L) 03/13/2018 1117   MCHC 26.7 (L) 03/13/2018 1117   RDW 23.5 (H) 03/13/2018 1117   LYMPHSABS 1.7 03/13/2018 1117   MONOABS 0.3 03/13/2018 1117   EOSABS 0.1 03/13/2018 1117   BASOSABS 0.0 03/13/2018 1117     CMP     Component Value Date/Time   NA 140 03/13/2018 1117   K 4.1 03/13/2018 1117   CL 105 03/13/2018 1117   CO2 24 03/13/2018 1117   GLUCOSE 90 03/13/2018 1117   BUN 10 03/13/2018 1117   CREATININE 0.70 03/13/2018 1117   CREATININE 0.61 10/06/2012 1537   CALCIUM 9.9 03/13/2018 1117   PROT 7.8 03/13/2018 1117   ALBUMIN  4.1 03/13/2018 1117   AST 24 03/13/2018 1117   ALT 19 03/13/2018 1117   ALKPHOS 84 03/13/2018 1117   BILITOT 0.4 03/13/2018 1117   GFRNONAA >60 03/13/2018 1117   GFRAA >60 03/13/2018 1117    Assessment: 1.  Screening for colorectal cancer: Patient is 52 years old and is never had a screening, her grandfather did have colon cancer in his 53s 2.  IDA: Due to known menorrhagia, patient being followed with oncology with Feraheme infusions 3.  Family history of colon cancer: In her grandfather, possibly in his 45s  Plan: 1.  Patient has been scheduled for a colonoscopy with Dr. Tarri Glenn on November 18.  This is for screening purposes.  Did discuss risk, benefits, limitations and alternatives and the patient agrees to proceed. 2.  Patient will continue to follow with oncology in regards to iron deficiency anemia related to her menorrhagia 3.  We will have patient recheck her hemoglobin a week before scheduled procedure, if this is at or below 7 would recommend she have a blood transfusion prior to time of procedure 4.  Patient to follow in clinic per recommendations from Dr. Tarri Glenn after time of procedure.  Carmen Newer, PA-C [facility] 03/28/2018, 3:26 PM  Cc: Flossie Buffy, NP

## 2018-03-28 NOTE — Patient Instructions (Signed)
If you are age 50 or older, your body mass index should be between 23-30. Your Body mass index is 31.93 kg/m. If this is out of the aforementioned range listed, please consider follow up with your Primary Care Provider.  If you are age 62 or younger, your body mass index should be between 19-25. Your Body mass index is 31.93 kg/m. If this is out of the aformentioned range listed, please consider follow up with your Primary Care Provider.   You have been scheduled for a colonoscopy. Please follow written instructions given to you at your visit today.  Please pick up your prep supplies at the pharmacy within the next 1-3 days. If you use inhalers (even only as needed), please bring them with you on the day of your procedure. Your physician has requested that you go to www.startemmi.com and enter the access code given to you at your visit today. This web site gives a general overview about your procedure. However, you should still follow specific instructions given to you by our office regarding your preparation for the procedure.  Your provider has requested that you go to the basement level for lab work before leaving today. Press "B" on the elevator. The lab is located at the first door on the left as you exit the elevator. CBC on 04/10/18  Thank you for choosing me and [facility].   Ellouise Newer, PA-C

## 2018-03-29 ENCOUNTER — Ambulatory Visit: Payer: 59 | Admitting: Physical Therapy

## 2018-03-29 ENCOUNTER — Encounter: Payer: Self-pay | Admitting: Physical Therapy

## 2018-03-29 ENCOUNTER — Encounter: Payer: Self-pay | Admitting: Physician Assistant

## 2018-03-29 DIAGNOSIS — M25562 Pain in left knee: Secondary | ICD-10-CM | POA: Diagnosis not present

## 2018-03-29 DIAGNOSIS — M25662 Stiffness of left knee, not elsewhere classified: Secondary | ICD-10-CM

## 2018-03-29 DIAGNOSIS — R29898 Other symptoms and signs involving the musculoskeletal system: Secondary | ICD-10-CM | POA: Diagnosis not present

## 2018-03-29 DIAGNOSIS — G8929 Other chronic pain: Secondary | ICD-10-CM

## 2018-03-29 DIAGNOSIS — M25561 Pain in right knee: Secondary | ICD-10-CM | POA: Diagnosis not present

## 2018-03-29 DIAGNOSIS — M6281 Muscle weakness (generalized): Secondary | ICD-10-CM

## 2018-03-29 NOTE — Progress Notes (Signed)
Reviewed. I agree with documentation including the assessment and plan.  Freddi Schrager L. Stacy Deshler, MD, MPH 

## 2018-03-29 NOTE — Therapy (Signed)
[facility] [address]  [employer] [place], [place], [address] Phone: [phone]   Fax:  [phone]  Physical Therapy Treatment  Patient Details  Name: Carmen Porter MRN: [id number] Date of Birth: 08/02/1965 Referring Provider (PT): Flossie Buffy, NP   Encounter Date: 03/29/2018  PT End of Session - 03/29/18 1745    Visit Number  2    Number of Visits  13    Date for PT Re-Evaluation  04/17/18    Authorization Type  [facility]    PT Start Time  1714   pt arrived late   PT Stop Time  1741   pt requested to leave early   PT Time Calculation (min)  27 min    Activity Tolerance  Patient tolerated treatment well    Behavior During Therapy  [facility] for tasks assessed/performed       Past Medical History:  Diagnosis Date  . Anemia   . Arthritis     History reviewed. No pertinent surgical history.  There were no vitals filed for this visit.  Subjective Assessment - 03/29/18 1716    Subjective  Patient late to appointment. Reports 5:00 appointments are not working out for work schedule. Has been going to MD to check her iron checked because it has been low. May need to have a hysterectomy d/t iron levels. Also scheduled for colonoscopy. Has been doing some of the HEP exercises.    Pertinent History  anemia    Diagnostic tests  02/15/18 L knee xray: Mild patellofemoral degenerative changes are noted; 02/15/18 R knee xray: Mild degenerative changes are noted most marked in the lateral joint space    Patient Stated Goals  "i wish my knees would get back to where they used to be"    Currently in Pain?  Yes    Pain Score  3     Pain Location  Knee    Pain Orientation  Left;Right    Pain Descriptors / Indicators  Aching    Pain Type  Chronic pain                       [facility] Adult PT Treatment/Exercise - 03/29/18 0001      Exercises   Exercises  Knee/Hip      Knee/Hip Exercises: Stretches   Hip Flexor  Stretch  Right;Left;30 seconds;Limitations;2 reps    Hip Flexor Stretch Limitations  mod thomas with strap      Knee/Hip Exercises: Aerobic   Nustep  L2 x 65min      Knee/Hip Exercises: Supine   Quad Sets  Strengthening;Right;Left;1 set;5 reps;Limitations    Quad Sets Limitations  5x10" each LE with towel roll under knee    Bridges  Strengthening;Both;2 sets;10 reps;Limitations    Bridges Limitations  2nd set with red TB around knees    Straight Leg Raises  Strengthening;Right;Left;10 reps;Limitations;2 sets    Straight Leg Raises Limitations  2nd set with 1#; cues for quad ste    Other Supine Knee/Hip Exercises  supine resisted hip flexion with red TB around toes x20      Knee/Hip Exercises: Sidelying   Clams  B LEs 10x each with red TB around knees   cues to rotate hips forward            PT Education - 03/29/18 1745    Education Details  update to HEPl administered red TB    Person(s) Educated  Patient  Methods  Explanation;Demonstration;Tactile cues;Verbal cues;Handout    Comprehension  Verbalized understanding;Returned demonstration       PT Short Term Goals - 03/29/18 1750      PT SHORT TERM GOAL #1   Title  Patient to be independent with initial HEP.    Time  3    Period  Weeks    Status  On-going        PT Long Term Goals - 03/29/18 1750      PT LONG TERM GOAL #1   Title  Patient to be independent with advanced HEP.    Time  6    Period  Weeks    Status  On-going      PT LONG TERM GOAL #2   Title  Patient to demonstrate WFL and pain-free B knee AROM/PROM.    Time  6    Period  Weeks    Status  On-going      PT LONG TERM GOAL #3   Title  Patient to demonstrate >=4+/5 strength in B LEs.    Time  6    Period  Weeks    Status  On-going      PT LONG TERM GOAL #4   Title  Patient to report 60% improvement in pain levels when performing STS transfer after prolonged sitting.    Time  6    Period  Weeks    Status  On-going            Plan  - 03/29/18 1746    Clinical Impression Statement  Patient arrived to session late, reporting that this appointment time is not ideal for her d/t having to work night shifts. Requesting to leave early today to go to work. Reports having to undergo several tests d/t low iron levels lately. Reviewed HEP and corrected form during SLR in order to promote quad contraction. Patient required manual and verbal cues to correct trunk rotation during clamshells. Able to tolerate progression of bridges and clamshells today with banded resistance at knees without issues. Updated HEP to reflect these changes and administered red band. Patient reported understanding. Patient reported improvement in pain levels at end of session.     PT Treatment/Interventions  ADLs/Self Care Home Management;Cryotherapy;Electrical Stimulation;Iontophoresis 4mg /ml Dexamethasone;Moist Heat;Ultrasound;DME Instruction;Gait training;Stair training;Functional mobility training;Therapeutic activities;Therapeutic exercise;Manual techniques;Patient/family education;Neuromuscular re-education;Balance training;Passive range of motion;Dry needling;Energy conservation;Splinting;Taping;Vasopneumatic Device    Consulted and Agree with Plan of Care  Patient       Patient will benefit from skilled therapeutic intervention in order to improve the following deficits and impairments:  Increased edema, Decreased activity tolerance, Decreased strength, Pain, Difficulty walking, Decreased range of motion, Improper body mechanics, Postural dysfunction, Impaired flexibility, Hypermobility  Visit Diagnosis: Chronic pain of left knee  Chronic pain of right knee  Stiffness of left knee, not elsewhere classified  Muscle weakness (generalized)  Other symptoms and signs involving the musculoskeletal system     Problem List Patient Active Problem List   Diagnosis Date Noted  . Pain in both lower extremities 02/23/2018  . Constipation 02/23/2018  .  External hemorrhoids without complication 53/66/4403  . Left wrist pain 10/06/2012  . Thyromegaly 10/06/2012  . Routine general medical examination at a health care facility 10/06/2012  . Iron deficiency anemia due to chronic blood loss 09/23/2011  . Menorrhagia 09/23/2011    Janene Harvey, PT, DPT 03/29/18 5:51 PM   [facility] [address]  [place], [place], [address]  Phone: [phone]   Fax:  [phone]  Name: Carmen Porter MRN: [id number] Date of Birth: 1965-11-04

## 2018-03-31 ENCOUNTER — Ambulatory Visit: Payer: 59 | Attending: Nurse Practitioner

## 2018-03-31 DIAGNOSIS — R29898 Other symptoms and signs involving the musculoskeletal system: Secondary | ICD-10-CM | POA: Diagnosis not present

## 2018-03-31 DIAGNOSIS — G8929 Other chronic pain: Secondary | ICD-10-CM | POA: Insufficient documentation

## 2018-03-31 DIAGNOSIS — M25562 Pain in left knee: Secondary | ICD-10-CM | POA: Diagnosis not present

## 2018-03-31 DIAGNOSIS — M25662 Stiffness of left knee, not elsewhere classified: Secondary | ICD-10-CM | POA: Insufficient documentation

## 2018-03-31 DIAGNOSIS — M6281 Muscle weakness (generalized): Secondary | ICD-10-CM | POA: Diagnosis not present

## 2018-03-31 DIAGNOSIS — M25561 Pain in right knee: Secondary | ICD-10-CM | POA: Diagnosis not present

## 2018-03-31 NOTE — Therapy (Addendum)
[facility] [address]  [place], [place], [address] Phone: [phone]   Fax:  [phone]  Physical Therapy Treatment  Patient Details  Name: Carmen Porter MRN: [id number] Date of Birth: 03-10-1966 Referring Provider (PT): Flossie Buffy, NP   Encounter Date: 03/31/2018  PT End of Session - 03/31/18 1125    Visit Number  3    Number of Visits  13    Date for PT Re-Evaluation  04/17/18    Authorization Type  [facility]    PT Start Time  1118   Pt. arrived late    PT Stop Time  1156    PT Time Calculation (min)  38 min    Activity Tolerance  Patient tolerated treatment well    Behavior During Therapy  WFL for tasks assessed/performed       Past Medical History:  Diagnosis Date  . Anemia   . Arthritis     No past surgical history on file.  There were no vitals filed for this visit.  Subjective Assessment - 03/31/18 1124    Subjective  Pt. noting increased B knee pain.      Pertinent History  anemia    Diagnostic tests  02/15/18 L knee xray: Mild patellofemoral degenerative changes are noted; 02/15/18 R knee xray: Mild degenerative changes are noted most marked in the lateral joint space    Patient Stated Goals  "i wish my knees would get back to where they used to be"    Currently in Pain?  Yes    Pain Score  5     Pain Location  Knee    Pain Orientation  Left;Right    Pain Descriptors / Indicators  Aching    Pain Type  Chronic pain    Aggravating Factors   stand > sit     Pain Relieving Factors  rest     Multiple Pain Sites  No                       [facility] Adult PT Treatment/Exercise - 03/31/18 1132      Knee/Hip Exercises: Stretches   Hip Flexor Stretch  Right;Left;30 seconds;Limitations;2 reps    Hip Flexor Stretch Limitations  mod thomas with strap      Knee/Hip Exercises: Aerobic   Nustep  L2 x 58mn      Knee/Hip Exercises: Standing   Terminal Knee Extension   Right;Left;1 set;15 reps;Theraband    Theraband Level (Terminal Knee Extension)  Level 3 (Green)    Terminal Knee Extension Limitations  therapist anchored       Knee/Hip Exercises: Supine   Bridges  --   x 12 reps; B hip abd/ER insometrics into red TB at knees    Bridges Limitations  red TB at knees     Straight Leg Raises  Right;Left;Strengthening;1 set   x 12 reps    Straight Leg Raises Limitations  1#      Knee/Hip Exercises: Sidelying   Clams  B LEs 12x each with red TB around knees               PT Short Term Goals - 03/29/18 1750      PT SHORT TERM GOAL #1   Title  Patient to be independent with initial HEP.    Time  3    Period  Weeks    Status  On-going  PT Long Term Goals - 03/29/18 1750      PT LONG TERM GOAL #1   Title  Patient to be independent with advanced HEP.    Time  6    Period  Weeks    Status  On-going      PT LONG TERM GOAL #2   Title  Patient to demonstrate WFL and pain-free B knee AROM/PROM.    Time  6    Period  Weeks    Status  On-going      PT LONG TERM GOAL #3   Title  Patient to demonstrate >=4+/5 strength in B LEs.    Time  6    Period  Weeks    Status  On-going      PT LONG TERM GOAL #4   Title  Patient to report 60% improvement in pain levels when performing STS transfer after prolonged sitting.    Time  6    Period  Weeks    Status  On-going            Plan - 03/31/18 1126    Clinical Impression Statement  Pt. arrived late to session.  Tolerated mild progression of LE strengthening well today.  Reviewed HEP today with pt. able to perform clam shell with red TB without substitutions now.  Ended visit with pt. noting decreased B knee pain from initial to start session.      PT Treatment/Interventions  ADLs/Self Care Home Management;Cryotherapy;Electrical Stimulation;Iontophoresis 86m/ml Dexamethasone;Moist Heat;Ultrasound;DME Instruction;Gait training;Stair training;Functional mobility training;Therapeutic  activities;Therapeutic exercise;Manual techniques;Patient/family education;Neuromuscular re-education;Balance training;Passive range of motion;Dry needling;Energy conservation;Splinting;Taping;Vasopneumatic Device    Consulted and Agree with Plan of Care  Patient       Patient will benefit from skilled therapeutic intervention in order to improve the following deficits and impairments:  Increased edema, Decreased activity tolerance, Decreased strength, Pain, Difficulty walking, Decreased range of motion, Improper body mechanics, Postural dysfunction, Impaired flexibility, Hypermobility  Visit Diagnosis: Chronic pain of left knee  Chronic pain of right knee  Stiffness of left knee, not elsewhere classified  Muscle weakness (generalized)  Other symptoms and signs involving the musculoskeletal system     Problem List Patient Active Problem List   Diagnosis Date Noted  . Pain in both lower extremities 02/23/2018  . Constipation 02/23/2018  . External hemorrhoids without complication 032/04/2481 . Left wrist pain 10/06/2012  . Thyromegaly 10/06/2012  . Routine general medical examination at a health care facility 10/06/2012  . Iron deficiency anemia due to chronic blood loss 09/23/2011  . Menorrhagia 09/23/2011    MBess Harvest PTA 03/31/18 12:06 PM  [facility] [address] [place] [address]Phone: [phone]  Fax:  [phone] Name: Carmen PLOTTMRN: [id number] of Birth: [birth year] PHYSICAL THERAPY DISCHARGE SUMMARY  Visits from Start of Care: 3  Current functional level related to goals / functional outcomes: Unable to assess; patient did not return since last session   Remaining deficits: Unable to assess   Education / Equipment: HEP  Plan: Patient agrees to discharge.  Patient goals were not met. Patient is being discharged due to not returning since the last visit.  ?????      YJanene Harvey PT, DPT 05/09/18 3:53 PM

## 2018-04-03 ENCOUNTER — Encounter: Payer: Self-pay | Admitting: Obstetrics & Gynecology

## 2018-04-03 ENCOUNTER — Ambulatory Visit (INDEPENDENT_AMBULATORY_CARE_PROVIDER_SITE_OTHER): Payer: 59 | Admitting: Obstetrics & Gynecology

## 2018-04-03 VITALS — BP 131/75 | HR 70 | Ht 68.0 in | Wt 208.1 lb

## 2018-04-03 DIAGNOSIS — N951 Menopausal and female climacteric states: Secondary | ICD-10-CM

## 2018-04-03 DIAGNOSIS — D219 Benign neoplasm of connective and other soft tissue, unspecified: Secondary | ICD-10-CM | POA: Diagnosis not present

## 2018-04-03 DIAGNOSIS — D500 Iron deficiency anemia secondary to blood loss (chronic): Secondary | ICD-10-CM

## 2018-04-03 DIAGNOSIS — N921 Excessive and frequent menstruation with irregular cycle: Secondary | ICD-10-CM

## 2018-04-03 MED ORDER — MEGESTROL ACETATE 40 MG PO TABS: 40.0000 mg | ORAL_TABLET | Freq: Two times a day (BID) | ORAL | 5 refills | Status: DC

## 2018-04-03 NOTE — Progress Notes (Signed)
Subjective:     Carmen Porter is a 52 y.o. female here for a routine exam. LMP 8/20196 Cycles are irreg now every 2 months. The cycles are heavy with clots lastiing ~7days. Pt reports freq urination.  Current complaints: heavy bleeding requiring iron infusion. Pt denies pain from fibroids.   Knee pain. Pt is in PT. Pt dx'd with fibroids on her last PE.     Gynecologic History Patient's last menstrual period was 01/23/2018. Contraception: tubal ligation Last Pap: 02/23/2018. Results were: normal Last mammogram: 2 year prev. Results were: never had  Obstetric History OB History  Gravida Para Term Preterm AB Living  5 4 4   1 4   SAB TAB Ectopic Multiple Live Births    1     4    # Outcome Date GA Lbr Len/2nd Weight Sex Delivery Anes PTL Lv  5 Term 2003 [redacted]w[redacted]d   F Vag-Spont   LIV  4 Term 1999 [redacted]w[redacted]d   F Vag-Spont   LIV  3 TAB 1991          2 Term 8 [redacted]w[redacted]d   M Vag-Spont   LIV  1 Term 53 [redacted]w[redacted]d   F Vag-Spont   LIV   The following portions of the patient's history were reviewed and updated as appropriate: allergies, current medications, past family history, past medical history, past social history, past surgical history and problem list.  Review of Systems Pertinent items are noted in HPI.    Objective:  BP 131/75   Pulse 70   Ht 5\' 8"  (1.727 m)   Wt 208 lb 1.3 oz (94.4 kg)   LMP 01/23/2018   BMI 31.64 kg/m   CONSTITUTIONAL: Well-developed, well-nourished female in no acute distress.  HENT:  Normocephalic, atraumatic EYES: Conjunctivae and EOM are normal. No scleral icterus.  NECK: Normal range of motion SKIN: Skin is warm and dry. No rash noted. Not diaphoretic.No pallor. Orange: Alert and oriented to person, place, and time. Normal coordination.  Lungs: CTA CV: RRR Abd; soft, NT, ND. No masses palpated GU: EGBUS: no lesions Vagina: no blood in vault Cervix: no lesion; no mucopurulent d/c Uterus: enlarged uterus ~14 weeks sized mobile Adnexa: no masses; non  tender     Assessment:  1. Iron deficiency anemia due to chronic blood loss - US PELVIS (TRANSABDOMINAL ONLY); Future - US PELVIS TRANSVANGINAL NON-OB (TV ONLY); Future Megace 40mg  bid   2. Fibroids - US PELVIS (TRANSABDOMINAL ONLY); Future - US PELVIS TRANSVANGINAL NON-OB (TV ONLY); Future  3. Menorrhagia with irregular cycle - US PELVIS (TRANSABDOMINAL ONLY); Future - US PELVIS TRANSVANGINAL NON-OB (TV ONLY); Future  4. Perimenopause  F/u in 3 months or sooner prn  Total face-to-face time with patient was 25 min.  Greater than 50% was spent in counseling and coordination of care with the patient.   Carmen Porter, M.D., Carmen Porter

## 2018-04-03 NOTE — Patient Instructions (Signed)
Uterine Fibroids Uterine fibroids are tissue masses (tumors) that can develop in the womb (uterus). They are also called leiomyomas. This type of tumor is not cancerous (benign) and does not spread to other parts of the body outside of the pelvic area, which is between the hip bones. Occasionally, fibroids may develop in the fallopian tubes, in the cervix, or on the support structures (ligaments) that surround the uterus. You can have one or many fibroids. Fibroids can vary in size, weight, and where they grow in the uterus. Some can become quite large. Most fibroids do not require medical treatment. What are the causes? A fibroid can develop when a single uterine cell keeps growing (replicating). Most cells in the human body have a control mechanism that keeps them from replicating without control. What are the signs or symptoms? Symptoms may include:  Heavy bleeding during your period.  Bleeding or spotting between periods.  Pelvic pain and pressure.  Bladder problems, such as needing to urinate more often (urinary frequency) or urgently.  Inability to reproduce offspring (infertility).  Miscarriages.  How is this diagnosed? Uterine fibroids are diagnosed through a physical exam. Your health care provider may feel the lumpy tumors during a pelvic exam. Ultrasonography and an MRI may be done to determine the size, location, and number of fibroids. How is this treated? Treatment may include:  Watchful waiting. This involves getting the fibroid checked by your health care provider to see if it grows or shrinks. Follow your health care provider's recommendations for how often to have this checked.  Hormone medicines. These can be taken by mouth or given through an intrauterine device (IUD).  Surgery. ? Removing the fibroids (myomectomy) or the uterus (hysterectomy). ? Removing blood supply to the fibroids (uterine artery embolization).  If fibroids interfere with your fertility and you  want to become pregnant, your health care provider may recommend having the fibroids removed. Follow these instructions at home:  Keep all follow-up visits as directed by your health care provider. This is important.  Take over-the-counter and prescription medicines only as told by your health care provider. ? If you were prescribed a hormone treatment, take the hormone medicines exactly as directed.  Ask your health care provider about taking iron pills and increasing the amount of dark green, leafy vegetables in your diet. These actions can help to boost your blood iron levels, which may be affected by heavy menstrual bleeding.  Pay close attention to your period and tell your health care provider about any changes, such as: ? Increased blood flow that requires you to use more pads or tampons than usual per month. ? A change in the number of days that your period lasts per month. ? A change in symptoms that are associated with your period, such as abdominal cramping or back pain. Contact a health care provider if:  You have pelvic pain, back pain, or abdominal cramps that cannot be controlled with medicines.  You have an increase in bleeding between and during periods.  You soak tampons or pads in a half hour or less.  You feel lightheaded, extra tired, or weak. Get help right away if:  You faint.  You have a sudden increase in pelvic pain. This information is not intended to replace advice given to you by your health care provider. Make sure you discuss any questions you have with your health care provider. Document Released: 05/14/2000 Document Revised: 01/15/2016 Document Reviewed: 11/13/2013 Elsevier Interactive Patient Education  2018 [employer] Inc[employer]  

## 2018-04-05 ENCOUNTER — Ambulatory Visit (HOSPITAL_BASED_OUTPATIENT_CLINIC_OR_DEPARTMENT_OTHER)
Admission: RE | Admit: 2018-04-05 | Discharge: 2018-04-05 | Disposition: A | Discharge status: Home / Self Care | Payer: 59 | Source: Ambulatory Visit | Attending: Nurse Practitioner | Admitting: Nurse Practitioner

## 2018-04-05 ENCOUNTER — Ambulatory Visit (HOSPITAL_BASED_OUTPATIENT_CLINIC_OR_DEPARTMENT_OTHER): Payer: 59

## 2018-04-05 ENCOUNTER — Ambulatory Visit (HOSPITAL_BASED_OUTPATIENT_CLINIC_OR_DEPARTMENT_OTHER)
Admission: RE | Admit: 2018-04-05 | Discharge: 2018-04-05 | Disposition: A | Discharge status: Home / Self Care | Payer: 59 | Source: Ambulatory Visit | Attending: Obstetrics & Gynecology | Admitting: Obstetrics & Gynecology

## 2018-04-05 DIAGNOSIS — N921 Excessive and frequent menstruation with irregular cycle: Secondary | ICD-10-CM | POA: Insufficient documentation

## 2018-04-05 DIAGNOSIS — N858 Other specified noninflammatory disorders of uterus: Secondary | ICD-10-CM | POA: Diagnosis not present

## 2018-04-05 DIAGNOSIS — D251 Intramural leiomyoma of uterus: Secondary | ICD-10-CM | POA: Diagnosis not present

## 2018-04-05 DIAGNOSIS — Z1231 Encounter for screening mammogram for malignant neoplasm of breast: Secondary | ICD-10-CM | POA: Diagnosis not present

## 2018-04-05 DIAGNOSIS — D500 Iron deficiency anemia secondary to blood loss (chronic): Secondary | ICD-10-CM | POA: Diagnosis not present

## 2018-04-05 DIAGNOSIS — D219 Benign neoplasm of connective and other soft tissue, unspecified: Secondary | ICD-10-CM

## 2018-04-05 DIAGNOSIS — Z1239 Encounter for other screening for malignant neoplasm of breast: Secondary | ICD-10-CM

## 2018-04-05 MED FILL — MEGESTROL 40 MG TABLET: 40 | 15 days supply | Qty: 60 | Fill #0

## 2018-04-12 ENCOUNTER — Other Ambulatory Visit (INDEPENDENT_AMBULATORY_CARE_PROVIDER_SITE_OTHER): Payer: 59

## 2018-04-12 DIAGNOSIS — D509 Iron deficiency anemia, unspecified: Secondary | ICD-10-CM | POA: Diagnosis not present

## 2018-04-12 LAB — CBC
HCT: 33.3 % — ABNORMAL LOW (ref 36.0–46.0)
HEMOGLOBIN: 10.5 g/dL — AB (ref 12.0–15.0)
MCHC: 31.6 g/dL (ref 30.0–36.0)
MCV: 73.6 fl — ABNORMAL LOW (ref 78.0–100.0)
PLATELETS: 229 10*3/uL (ref 150.0–400.0)
RBC: 4.53 Mil/uL (ref 3.87–5.11)
RDW: 27.6 % — ABNORMAL HIGH (ref 11.5–15.5)
WBC: 4.7 10*3/uL (ref 4.0–10.5)

## 2018-04-13 ENCOUNTER — Ambulatory Visit: Payer: 59 | Admitting: Physical Therapy

## 2018-04-17 ENCOUNTER — Encounter: Payer: Self-pay | Admitting: Gastroenterology

## 2018-04-17 ENCOUNTER — Ambulatory Visit (AMBULATORY_SURGERY_CENTER): Payer: 59 | Admitting: Gastroenterology

## 2018-04-17 VITALS — BP 124/69 | HR 61 | Temp 99.6°F | Resp 13 | Ht 68.0 in | Wt 208.0 lb

## 2018-04-17 DIAGNOSIS — Z1211 Encounter for screening for malignant neoplasm of colon: Secondary | ICD-10-CM

## 2018-04-17 DIAGNOSIS — D124 Benign neoplasm of descending colon: Secondary | ICD-10-CM

## 2018-04-17 DIAGNOSIS — D128 Benign neoplasm of rectum: Secondary | ICD-10-CM

## 2018-04-17 DIAGNOSIS — D129 Benign neoplasm of anus and anal canal: Secondary | ICD-10-CM

## 2018-04-17 HISTORY — PX: COLONOSCOPY W/ POLYPECTOMY: SHX1380

## 2018-04-17 MED ORDER — SODIUM CHLORIDE 0.9 % IV SOLN: 500.0000 mL | Freq: Once | INTRAVENOUS | Status: DC

## 2018-04-17 NOTE — Patient Instructions (Signed)
Handouts given on polyps and hemorrhoids   YOU HAD AN ENDOSCOPIC PROCEDURE TODAY AT THE [facility]:   Refer to the procedure report that was given to you for any specific questions about what was found during the examination.  If the procedure report does not answer your questions, please call your gastroenterologist to clarify.  If you requested that your care partner not be given the details of your procedure findings, then the procedure report has been included in a sealed envelope for you to review at your convenience later.  YOU SHOULD EXPECT: Some feelings of bloating in the abdomen. Passage of more gas than usual.  Walking can help get rid of the air that was put into your GI tract during the procedure and reduce the bloating. If you had a lower endoscopy (such as a colonoscopy or flexible sigmoidoscopy) you may notice spotting of blood in your stool or on the toilet paper. If you underwent a bowel prep for your procedure, you may not have a normal bowel movement for a few days.  Please Note:  You might notice some irritation and congestion in your nose or some drainage.  This is from the oxygen used during your procedure.  There is no need for concern and it should clear up in a day or so.  SYMPTOMS TO REPORT IMMEDIATELY:   Following lower endoscopy (colonoscopy or flexible sigmoidoscopy):  Excessive amounts of blood in the stool  Significant tenderness or worsening of abdominal pains  Swelling of the abdomen that is new, acute  Fever of 100F or higher    For urgent or emergent issues, a gastroenterologist can be reached at any hour by calling [phone].   DIET:  We do recommend a small meal at first, but then you may proceed to your regular diet.  Drink plenty of fluids but you should avoid alcoholic beverages for 24 hours.  ACTIVITY:  You should plan to take it easy for the rest of today and you should NOT DRIVE or use heavy machinery until tomorrow (because of  the sedation medicines used during the test).    FOLLOW UP: Our staff will call the number listed on your records the next business day following your procedure to check on you and address any questions or concerns that you may have regarding the information given to you following your procedure. If we do not reach you, we will leave a message.  However, if you are feeling well and you are not experiencing any problems, there is no need to return our call.  We will assume that you have returned to your regular daily activities without incident.  If any biopsies were taken you will be contacted by phone or by letter within the next 1-3 weeks.  Please call us at [phone] if you have not heard about the biopsies in 3 weeks.    SIGNATURES/CONFIDENTIALITY: You and/or your care partner have signed paperwork which will be entered into your electronic medical record.  These signatures attest to the fact that that the information above on your After Visit Summary has been reviewed and is understood.  Full responsibility of the confidentiality of this discharge information lies with you and/or your care-partner. 

## 2018-04-17 NOTE — Op Note (Signed)
[place] Patient Name: Carmen Porter Procedure Date: 04/17/2018 10:23 AM MRN: [id number] Endoscopist: Thornton Park MD, MD Age: 52 Referring MD:  Date of Birth: 1966/05/17 Gender: Female Account #: [id number] Procedure:                Colonoscopy Indications:              Screening for colorectal malignant neoplasm, This                            is the patient's first colonoscopy. No known family                            history of colon cancer or polyps. No baseline GI                            symptoms. Medicines:                See the Anesthesia note for documentation of the                            administered medications Procedure:                Pre-Anesthesia Assessment:                           - Prior to the procedure, a History and Physical                            was performed, and patient medications and                            allergies were reviewed. The patient's tolerance of                            previous anesthesia was also reviewed. The risks                            and benefits of the procedure and the sedation                            options and risks were discussed with the patient.                            All questions were answered, and informed consent                            was obtained. Prior Anticoagulants: The patient has                            taken no previous anticoagulant or antiplatelet                            agents. ASA Grade Assessment: II - A patient with  mild systemic disease. After reviewing the risks                            and benefits, the patient was deemed in                            satisfactory condition to undergo the procedure.                           After obtaining informed consent, the colonoscope                            was passed under direct vision. Throughout the                            procedure, the patient's blood pressure, pulse,  and                            oxygen saturations were monitored continuously. The                            Model CF-HQ190L [id number]) scope was introduced                            through the anus and advanced to the the terminal                            ileum, with identification of the appendiceal                            orifice and IC valve. The colonoscopy was performed                            with moderate difficulty due to a redundant colon,                            significant looping and a tortuous colon.                            Successful completion of the procedure was aided by                            applying abdominal pressure. The patient tolerated                            the procedure well. The quality of the bowel                            preparation was good. Scope In: 10:31:34 AM Scope Out: 10:54:17 AM Scope Withdrawal Time: 0 hours 11 minutes 26 seconds  Total Procedure Duration: 0 hours 22 minutes 43 seconds  Findings:                 Hemorrhoids were found on perianal exam.  A 12 mm polyp was found in the proximal descending                            colon. The polyp was pedunculated. The polyp was                            removed with a hot snare. Resection and retrieval                            were complete.                           A 3 mm polyp was found in the descending colon. The                            polyp was sessile. The polyp was removed with a                            cold snare. Resection and retrieval were complete.                           A 4 mm polyp was found in the rectum. The polyp was                            sessile. The polyp was removed with a cold snare.                            Resection and retrieval were complete.                           The exam was otherwise without abnormality on                            direct and retroflexion views.                           The  exam was otherwise without abnormality on                            direct and retroflexion views. Complications:            No immediate complications. Estimated Blood Loss:     Estimated blood loss was minimal. Impression:               - Hemorrhoids found on perianal exam.                           - One 12 mm polyp in the proximal descending colon,                            removed with a hot snare. Resected and retrieved.                           - One 3 mm  polyp in the descending colon, removed                            with a cold snare. Resected and retrieved.                           - One 4 mm polyp in the rectum, removed with a cold                            snare. Resected and retrieved.                           - The examination was otherwise normal on direct                            and retroflexion views. Recommendation:           - Discharge patient to home.                           - Resume regular diet.                           - Continue present medications.                           - Await pathology results.                           - Repeat colonoscopy date to be determined after                            pending pathology results are reviewed for                            surveillance based on pathology results. Thornton Park MD, MD 04/17/2018 10:58:43 AM This report has been signed electronically.

## 2018-04-17 NOTE — Progress Notes (Signed)
Called to room to assist during endoscopic procedure.  Patient ID and intended procedure confirmed with present staff. Received instructions for my participation in the procedure from the performing physician.  

## 2018-04-17 NOTE — Progress Notes (Signed)
To PACU, VSS. Report to [email or web address] 

## 2018-04-18 ENCOUNTER — Telehealth: Payer: Self-pay | Admitting: *Deleted

## 2018-04-18 NOTE — Telephone Encounter (Signed)
  Follow up Call-  Call back number 04/17/2018  Post procedure Call Back phone  # [phone]  Permission to leave phone message Yes  Some recent data might be hidden     Patient questions:  Do you have a fever, pain , or abdominal swelling? No. Pain Score  0 *  Have you tolerated food without any problems? Yes.    Have you been able to return to your normal activities? Yes.    Do you have any questions about your discharge instructions: Diet   No. Medications  No. Follow up visit  No.  Do you have questions or concerns about your Care? No.  Actions: * If pain score is 4 or above: No action needed, pain <4.

## 2018-04-22 ENCOUNTER — Encounter: Payer: Self-pay | Admitting: Gastroenterology

## 2018-04-25 ENCOUNTER — Encounter: Payer: Self-pay | Admitting: Nurse Practitioner

## 2018-04-25 ENCOUNTER — Ambulatory Visit: Payer: 59 | Admitting: Nurse Practitioner

## 2018-04-25 VITALS — BP 138/74 | HR 64 | Temp 98.5°F | Ht 68.0 in | Wt 209.0 lb

## 2018-04-25 DIAGNOSIS — M25561 Pain in right knee: Secondary | ICD-10-CM

## 2018-04-25 DIAGNOSIS — G8929 Other chronic pain: Secondary | ICD-10-CM

## 2018-04-25 DIAGNOSIS — M79604 Pain in right leg: Secondary | ICD-10-CM | POA: Diagnosis not present

## 2018-04-25 DIAGNOSIS — M25562 Pain in left knee: Secondary | ICD-10-CM | POA: Diagnosis not present

## 2018-04-25 DIAGNOSIS — M79605 Pain in left leg: Secondary | ICD-10-CM | POA: Diagnosis not present

## 2018-04-25 MED ORDER — MELOXICAM 7.5 MG PO TABS: 7.5000 mg | ORAL_TABLET | Freq: Every day | ORAL | 0 refills | Status: DC

## 2018-04-25 MED ORDER — GABAPENTIN 100 MG PO CAPS: 100.0000 mg | ORAL_CAPSULE | Freq: Every day | ORAL | 3 refills | Status: DC | PRN

## 2018-04-25 MED FILL — MELOXICAM 7.5 MG TABLET: 7.5 | 90 days supply | Qty: 90 | Fill #0

## 2018-04-25 MED FILL — GABAPENTIN 100 MG CAPSULE: 100 | 30 days supply | Qty: 30 | Fill #0

## 2018-04-25 NOTE — Progress Notes (Signed)
Subjective:  Patient ID: Carmen Porter, female    DOB: September 14, 1965  Age: 52 y.o. MRN: [id number]  CC: Follow-up (2 mo fu/therapy is helping little, left knee is swelling and painful, cant take gabapentin because its make her sleepy. )  HPI  Bilateral Knee pain: Chronic, persistent, constant all day long, worse after prolong standing and sitting. Reports minimal improvement in knee pain with PT x 4weeks. Use of mobic with moderate improvement in pain. Needs refill. Unable to use gabapentin as prescribed due to increased somnolence. Gabapentin helps with sleep but not with pain. No hip or back pain, no paresthesia.  Reviewed past Medical, Social and Family history today.  Outpatient Medications Prior to Visit  Medication Sig Dispense Refill  . folic acid (FOLVITE) 1 MG tablet Take 1 tablet (1 mg total) by mouth daily. 30 tablet 11  . megestrol (MEGACE) 40 MG tablet Take 1 tablet (40 mg total) by mouth 2 (two) times daily. Can increase to two tablets twice a day in the event of heavy bleeding 60 tablet 5  . Multiple Vitamin (MULTIVITAMIN) capsule Take 1 capsule by mouth daily.    Marland Kitchen gabapentin (NEURONTIN) 100 MG capsule Take 1 capsule (100 mg total) by mouth at bedtime. 30 capsule 3  . meloxicam (MOBIC) 7.5 MG tablet Take 1 tablet (7.5 mg total) by mouth daily. 30 tablet 0  . docusate sodium (COLACE) 100 MG capsule Take 1 capsule (100 mg total) by mouth 2 (two) times daily as needed for mild constipation. (Patient not taking: Reported on 04/03/2018) 180 capsule 1   No facility-administered medications prior to visit.     ROS See HPI  Objective:  BP 138/74   Pulse 64   Temp 98.5 F (36.9 C) (Oral)   Ht 5\' 8"  (1.727 m)   Wt 209 lb (94.8 kg)   SpO2 99%   BMI 31.78 kg/m   BP Readings from Last 3 Encounters:  04/25/18 138/74  04/17/18 124/69  04/03/18 131/75    Wt Readings from Last 3 Encounters:  04/25/18 209 lb (94.8 kg)  04/17/18 208 lb (94.3 kg)  04/03/18 208 lb 1.3 oz  (94.4 kg)    Physical Exam  Cardiovascular: Normal rate.  Pulmonary/Chest: Effort normal.  Musculoskeletal: She exhibits edema and tenderness. She exhibits no deformity.       Right knee: She exhibits normal range of motion, no effusion and no erythema. No tenderness found.       Left knee: She exhibits effusion. She exhibits normal range of motion and no erythema. No tenderness found.  Crepitus in bilateral knees  Skin: Skin is warm and dry. No rash noted. No erythema.   Lab Results  Component Value Date   WBC 4.7 04/12/2018   HGB 10.5 (L) 04/12/2018   HCT 33.3 (L) 04/12/2018   PLT 229.0 04/12/2018   GLUCOSE 90 03/13/2018   CHOL 158 02/23/2018   TRIG 36.0 02/23/2018   HDL 62.30 02/23/2018   LDLCALC 88 02/23/2018   ALT 19 03/13/2018   AST 24 03/13/2018   NA 140 03/13/2018   K 4.1 03/13/2018   CL 105 03/13/2018   CREATININE 0.70 03/13/2018   BUN 10 03/13/2018   CO2 24 03/13/2018   TSH 0.98 02/23/2018    US Pelvis Transvanginal Non-ob (tv Only)  Addendum Date: 04/05/2018   ADDENDUM REPORT: 04/05/2018 14:28 ADDENDUM: Incorrect exam header. Examination performed was: TRANSABDOMINAL AND TRANSVAGINAL ULTRASOUND OF THE PELVIS TECHNIQUE: Both transabdominal and transvaginal ultrasound examinations of the  pelvis were performed. Transabdominal technique was performed for global imaging of the pelvis including uterus, ovaries, adnexal regions, and pelvic cul-de-sac. It was necessary to proceed with endovaginal exam following the transabdominal exam to visualize the endometrium. Remainder of report as previously dictated. Electronically Signed   By: Lavonia Dana M.D.   On: 04/05/2018 14:28   Result Date: 04/05/2018 CLINICAL DATA:  Iron deficiency anemia due to chronic blood loss, fibroids, menorrhagia with irregular cycle EXAM: ULTRASOUND PELVIS TRANSVAGINAL TECHNIQUE: Transvaginal ultrasound examination of the pelvis was performed including evaluation of the uterus, ovaries, adnexal  regions, and pelvic cul-de-sac. Transabdominal imaging was not performed. COMPARISON:  None FINDINGS: Uterus Measurements: 10.8 x 7.2 x 9.7 cm = volume: 395 mL. Asymmetric thickening of the anterior wall of the mid to upper uterus especially to the RIGHT, with slight posterior displacement of the endometrial stripe suggesting mass effect, question isoechoic leiomyoma approximately 4.1 x 4.6 x 4.9 cm versus other etiology potentially focal adenomyosis. Questionable small posterior heterogeneous and intramural nodule 11 x 8 x 14 mm, could represent a tiny leiomyoma. Endometrium Thickness: 10 mm thick.  No endometrial fluid or focal abnormality Right ovary Measurements: 3.0 x 1.9 x 2.7 cm = volume: 8 mL. Normal morphology without mass Left ovary Measurements: 3.6 x 2.4 x 3.5 cm = volume: 16 mL. Dominant follicle 2.8 cm diameter. No additional mass. Other findings:  No free pelvic fluid or additional adnexal masses. IMPRESSION: Unremarkable ovaries. Uterus appears enlarged with asymmetric thickening of the anterior wall of the upper to mid uterus especially to the RIGHT with slight mass effect upon endometrial stripe and heterogeneity of myometrial echogenicity, question subtle isoechoic leiomyoma up to 4.9 cm diameter versus potentially focal adenomyosis. Additional question small posterior intramural leiomyoma 14 mm diameter. If clinically indicated, uterus could be better assessed by MR to exclude focal mass. Electronically Signed: By: Lavonia Dana M.D. On: 04/05/2018 13:21   US Pelvis (transabdominal Only)  Result Date: 04/05/2018 CLINICAL DATA:  Iron deficiency anemia due to chronic blood loss, fibroids, menorrhagia with irregular cycle EXAM: TRANSABDOMINAL AND TRANSVAGINAL ULTRASOUND OF PELVIS TECHNIQUE: Both transabdominal and transvaginal ultrasound examinations of the pelvis were performed. Transabdominal technique was performed for global imaging of the pelvis including uterus, ovaries, adnexal regions, and  pelvic cul-de-sac. It was necessary to proceed with endovaginal exam following the transabdominal exam to visualize the endometrium. COMPARISON:  None FINDINGS: Uterus Measurements: 10.8 x 7.2 x 9.7 cm = volume: 395 mL. Asymmetric thickening of the anterior wall of the mid to upper uterus especially to the RIGHT, with slight posterior displacement of the endometrial stripe suggesting mass effect, question isoechoic leiomyoma approximately 4.1 x 4.6 x 4.9 cm versus other etiology potentially focal adenomyosis. Questionable small posterior heterogeneous and intramural nodule 11 x 8 x 14 mm, could represent a tiny leiomyoma Endometrium Thickness: 10 mm thick.  No endometrial fluid or focal abnormality Right ovary Measurements: 3.0 x 1.9 x 2.7 cm = volume: 8 mL. Normal morphology without mass Left ovary Measurements: 3.6 x 2.4 x 3.5 cm = volume: 16 mL. Dominant follicle 2.8 cm diameter. No additional mass. Other findings No free pelvic fluid or additional adnexal masses IMPRESSION: Unremarkable ovaries Uterus appears enlarged with asymmetric thickening of the anterior wall of the upper to mid uterus especially to the RIGHT with slight mass effect upon the endometrial stripe and heterogeneity of myometrial echogenicity, question subtle isoechoic leiomyoma up to 4.9 cm diameter versus potentially focal adenomyosis. Additional question small posterior intramural leiomyoma 14  mm diameter If clinically indicated, uterus could be better assessed by MR to exclude focal mass. Electronically Signed   By: Lavonia Dana M.D.   On: 04/05/2018 14:27   Mm 3d Screen Breast Bilateral  Result Date: 04/06/2018 CLINICAL DATA:  Screening. EXAM: DIGITAL SCREENING BILATERAL MAMMOGRAM WITH TOMO AND CAD COMPARISON:  Previous exam(s). ACR Breast Density Category c: The breast tissue is heterogeneously dense, which may obscure small masses. FINDINGS: There are no findings suspicious for malignancy. Images were processed with CAD. IMPRESSION:  No mammographic evidence of malignancy. A result letter of this screening mammogram will be mailed directly to the patient. RECOMMENDATION: Screening mammogram in one year. (Code:SM-B-01Y) BI-RADS CATEGORY  1: Negative. Electronically Signed   By: Lovey Newcomer M.D.   On: 04/06/2018 11:24    Assessment & Plan:   Carmen Porter was seen today for follow-up.  Diagnoses and all orders for this visit:  Chronic pain of both knees -     meloxicam (MOBIC) 7.5 MG tablet; Take 1 tablet (7.5 mg total) by mouth daily. With food -     gabapentin (NEURONTIN) 100 MG capsule; Take 1 capsule (100 mg total) by mouth daily as needed. -     Ambulatory referral to Sports Medicine  Pain in both lower extremities -     gabapentin (NEURONTIN) 100 MG capsule; Take 1 capsule (100 mg total) by mouth daily as needed.   I have changed Carmen C. Hassey's meloxicam and gabapentin. I am also having her maintain her multivitamin, docusate sodium, folic acid, and megestrol.  Meds ordered this encounter  Medications  . meloxicam (MOBIC) 7.5 MG tablet    Sig: Take 1 tablet (7.5 mg total) by mouth daily. With food    Dispense:  90 tablet    Refill:  0    Order Specific Question:   Supervising Provider    Answer:   Lucille Passy [[id number]]  . gabapentin (NEURONTIN) 100 MG capsule    Sig: Take 1 capsule (100 mg total) by mouth daily as needed.    Dispense:  30 capsule    Refill:  3    Order Specific Question:   Supervising Provider    Answer:   Lucille Passy [[id number]]    Problem List Items Addressed This Visit      Other   Pain in both lower extremities   Relevant Medications   gabapentin (NEURONTIN) 100 MG capsule    Other Visit Diagnoses    Chronic pain of both knees       Relevant Medications   meloxicam (MOBIC) 7.5 MG tablet   gabapentin (NEURONTIN) 100 MG capsule   Other Relevant Orders   Ambulatory referral to Sports Medicine      Follow-up: Return if symptoms worsen or fail to improve.  Wilfred Lacy, NP

## 2018-04-25 NOTE — Patient Instructions (Addendum)
Continue mobic as prescribed  Make appt with sports for additional evaluation of knees.  Encourage weight loss through low fat/low carb diet, small portions and low impact exercise.  Knee Pain, Adult Knee pain in adults is common. It can be caused by many things, including:  Arthritis.  A fluid-filled sac (cyst) or growth in your knee.  An infection in your knee.  An injury that will not heal.  Damage, swelling, or irritation of the tissues that support your knee.  Knee pain is usually not a sign of a serious problem. The pain may go away on its own with time and rest. If it does not, a health care provider may order tests to find the cause of the pain. These may include:  Imaging tests, such as an X-ray, MRI, or ultrasound.  Joint aspiration. In this test, fluid is removed from the knee.  Arthroscopy. In this test, a lighted tube is inserted into knee and an image is projected onto a TV screen.  A biopsy. In this test, a sample of tissue is removed from the body and studied under a microscope.  Follow these instructions at home: Pay attention to any changes in your symptoms. Take these actions to relieve your pain. Activity  Rest your knee.  Do not do things that cause pain or make pain worse.  Avoid high-impact activities or exercises, such as running, jumping rope, or doing jumping jacks. General instructions  Take over-the-counter and prescription medicines only as told by your health care provider.  Raise (elevate) your knee above the level of your heart when you are sitting or lying down.  Sleep with a pillow under your knee.  If directed, apply ice to the knee: ? Put ice in a plastic bag. ? Place a towel between your skin and the bag. ? Leave the ice on for 20 minutes, 2-3 times a day.  Ask your health care provider if you should wear an elastic knee support.  Lose weight if you are overweight. Extra weight can put pressure on your knee.  Do not use any  products that contain nicotine or tobacco, such as cigarettes and e-cigarettes. Smoking may slow the healing of any bone and joint problems that you may have. If you need help quitting, ask your health care provider. Contact a health care provider if:  Your knee pain continues, changes, or gets worse.  You have a fever along with knee pain.  Your knee buckles or locks up.  Your knee swells, and the swelling becomes worse. Get help right away if:  Your knee feels warm to the touch.  You cannot move your knee.  You have severe pain in your knee.  You have chest pain.  You have trouble breathing. Summary  Knee pain in adults is common. It can be caused by many things, including, arthritis, infection, cysts, or injury.  Knee pain is usually not a sign of a serious problem, but if it does not go away, a health care provider may perform tests to know the cause of the pain.  Pay attention to any changes in your symptoms. Relieve your pain with rest, medicines, light activity, and use of ice.  Get help if your pain continues or becomes very severe, or if your knee buckles or locks up, or if you have chest pain or trouble breathing. This information is not intended to replace advice given to you by your health care provider. Make sure you discuss any questions you have with  your health care provider. Document Released: 03/14/2007 Document Revised: 05/07/2016 Document Reviewed: 05/07/2016 Elsevier Interactive Patient Education  [employer].

## 2018-04-26 ENCOUNTER — Ambulatory Visit (INDEPENDENT_AMBULATORY_CARE_PROVIDER_SITE_OTHER): Payer: 59

## 2018-04-26 ENCOUNTER — Ambulatory Visit: Payer: 59 | Admitting: Family Medicine

## 2018-04-26 ENCOUNTER — Encounter: Payer: Self-pay | Admitting: Family Medicine

## 2018-04-26 VITALS — BP 134/74 | HR 71 | Temp 98.3°F | Ht 68.0 in | Wt 209.0 lb

## 2018-04-26 DIAGNOSIS — M25561 Pain in right knee: Secondary | ICD-10-CM

## 2018-04-26 DIAGNOSIS — G8929 Other chronic pain: Secondary | ICD-10-CM

## 2018-04-26 DIAGNOSIS — M25562 Pain in left knee: Secondary | ICD-10-CM

## 2018-04-26 MED ORDER — DICLOFENAC SODIUM 2 % TD SOLN: 1.0000 "application " | Freq: Two times a day (BID) | TRANSDERMAL | 3 refills | Status: DC

## 2018-04-26 NOTE — Assessment & Plan Note (Signed)
Has mild degenerative changes on imaging but does have effusion noted bilaterally.  Likely she has some degenerative meniscal changes that is contributing to her symptoms.  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 -Bilateral injections today. -Counseled on home exercise therapy and supportive care. -Pennsaid -If no improvement can consider gel injections.  Could consider medial unloader brace

## 2018-04-26 NOTE — Progress Notes (Signed)
Carmen Porter - 52 y.o. female MRN [id number]  Date of birth: 10/29/1965  SUBJECTIVE:  Including CC & ROS.  Chief Complaint  Patient presents with  . Pain    both knee pain,going for a while. med didnt really help. referral from [facility].     Carmen Porter is a 52 y.o. female that is presenting with acute on chronic bilateral knee pain.  She has pain that has stayed the same.  It is localized to the knee.  She does endorse swelling on both knees.  She has tried meloxicam and that seems to have helped.  She is been through physical therapy with little to no improvement.  The pain is anteriorly located.  Denies any inciting event.  First presented about 6 months ago.  Denies any locking or giving way.  Independent review of the right and left knee x-ray from 9/18 shows the right knee with mild medial joint space narrowing and left knee with mild patellofemoral narrowing.   Review of Systems  Constitutional: Negative for fever.  HENT: Negative for congestion.   Respiratory: Negative for cough.   Cardiovascular: Negative for chest pain.  Gastrointestinal: Negative for abdominal pain.  Musculoskeletal: Positive for arthralgias and joint swelling.  Skin: Negative for color change.  Neurological: Negative for weakness.  Hematological: Negative for adenopathy.  Psychiatric/Behavioral: Negative for agitation.    HISTORY: Past Medical, Surgical, Social, and Family History Reviewed & Updated per EMR.   Pertinent Historical Findings include:  Past Medical History:  Diagnosis Date  . Anemia   . Arthritis   . Blood transfusion without reported diagnosis     Past Surgical History:  Procedure Laterality Date  . TUBAL LIGATION      No Known Allergies  Family History  Problem Relation Age of Onset  . Stroke Father   . Hypertension Father   . Heart failure Father   . Heart disease Father   . Cancer Maternal Grandmother        cervical cancer  . Cancer Maternal Grandfather       prostate cancer  . Glaucoma Maternal Grandfather   . Breast cancer Maternal Aunt   . Colon cancer Neg Hx      Social History   Socioeconomic History  . Marital status: Married    Spouse name: Not on file  . Number of children: 4  . Years of education: Not on file  . Highest education level: Not on file  Occupational History  . Not on file  Social Needs  . Financial resource strain: Not on file  . Food insecurity:    Worry: Not on file    Inability: Not on file  . Transportation needs:    Medical: Not on file    Non-medical: Not on file  Tobacco Use  . Smoking status: Never Smoker  . Smokeless tobacco: Never Used  Substance and Sexual Activity  . Alcohol use: Yes    Comment: social  . Drug use: No  . Sexual activity: Not Currently  Lifestyle  . Physical activity:    Days per week: Not on file    Minutes per session: Not on file  . Stress: Not on file  Relationships  . Social connections:    Talks on phone: Not on file    Gets together: Not on file    Attends religious service: Not on file    Active member of club or organization: Not on file    Attends meetings of clubs  or organizations: Not on file    Relationship status: Not on file  . Intimate partner violence:    Fear of current or ex partner: Not on file    Emotionally abused: Not on file    Physically abused: Not on file    Forced sexual activity: Not on file  Other Topics Concern  . Not on file  Social History Narrative  . Not on file     PHYSICAL EXAM:  VS: BP 134/74   Pulse 71   Temp 98.3 F (36.8 C) (Oral)   Ht 5\' 8"  (1.727 m)   Wt 209 lb (94.8 kg)   SpO2 100%   BMI 31.78 kg/m  Physical Exam Gen: NAD, alert, cooperative with exam, well-appearing ENT: normal lips, normal nasal mucosa,  Eye: normal EOM, normal conjunctiva and lids CV:  no edema, +2 pedal pulses   Resp: no accessory muscle use, non-labored,  Skin: no rashes, no areas of induration  Neuro: normal tone, normal  sensation to touch Psych:  normal insight, alert and oriented MSK:  Left and right knee:  Normal to inspection with no erythema  obvious bony abnormalities. Mild effusion appreciated b/l  Palpation normal with no warmth, patellar tenderness, or condyle tenderness. No significant medial joint space TTP ROM full in flexion and extension and lower leg rotation. Instability with valgus and varus stress testing b/l  Positive Mcmurray's tests on the left. Negative on right. Pain with patellar compression. Patellar and quadriceps tendons unremarkable. Hamstring and quadriceps strength is normal.  Neurovascularly intact     Aspiration/Injection Procedure Note THERSEA MANFREDONIA 01/02/1966  Procedure: Injection Indications: left knee pain   Procedure Details Consent: Risks of procedure as well as the alternatives and risks of each were explained to the (patient/caregiver).  Consent for procedure obtained. Time Out: Verified patient identification, verified procedure, site/side was marked, verified correct patient position, special equipment/implants available, medications/allergies/relevent history reviewed, required imaging and test results available.  Performed.  The area was cleaned with iodine and alcohol swabs.    The left knee superior lateral suprapatellar pouch was injected using 1 cc's of 40 mg kenalog and 4 cc's of 0.25% bupivacaine with a 22 1 1/2" needle.  Ultrasound was used. Images were obtained in Long views showing the injection.    A sterile dressing was applied.  Patient did tolerate procedure well.   Aspiration/Injection Procedure Note TATTIANA FAKHOURI [birth year]  Procedure: Injection Indications: right knee pain   Procedure Details Consent: Risks of procedure as well as the alternatives and risks of each were explained to the (patient/caregiver).  Consent for procedure obtained. Time Out: Verified patient identification, verified procedure, site/side was marked,  verified correct patient position, special equipment/implants available, medications/allergies/relevent history reviewed, required imaging and test results available.  Performed.  The area was cleaned with iodine and alcohol swabs.    The right knee superior lateral suprapatellar pouch was injected using 1 cc's of 40 mg kenalog and 4 cc's of 0.25% bupivacaine with a 22 1 1/2" needle.  Ultrasound was used. Images were obtained in  Long views showing the injection.    A sterile dressing was applied.  Patient did tolerate procedure well.     ASSESSMENT & PLAN:   Chronic pain of both knees Has mild degenerative changes on imaging but does have effusion noted bilaterally.  Likely she has some degenerative meniscal changes that is contributing to her symptoms.  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 -Bilateral injections today. -Counseled  on home exercise therapy and supportive care. -Pen[place]aid -If no improvement can consider gel injections.  Could consider medial unloader brace

## 2018-04-26 NOTE — Patient Instructions (Signed)
Nice to meet you  Take tylenol 650 mg three times a day is the best evidence based medicine we have for arthritis.  Glucosamine sulfate 750mg  twice a day is a supplement that has been shown to help moderate to severe arthritis. Vitamin D 2000 IU daily Fish oil 2 grams daily.  Tumeric 500mg  twice daily.  Capsaicin topically up to four times a day may also help with pain. Cortisone injections are an option if these interventions do not seem to make a difference or need more relief.  If cortisone injections do not help, there are different types of shots that may help but they take longer to take effect.  We can discuss this at follow up.  Please try the exercises  Please see me back in 3-4 weeks if your symptoms are not improved.

## 2018-05-15 MED FILL — FOLIC ACID 1 MG TABS: 1 | 30 days supply | Qty: 30 | Fill #0

## 2018-05-29 ENCOUNTER — Ambulatory Visit: Payer: 59 | Admitting: Family Medicine

## 2018-05-30 ENCOUNTER — Ambulatory Visit: Payer: 59 | Admitting: Family Medicine

## 2018-05-30 DIAGNOSIS — M25561 Pain in right knee: Secondary | ICD-10-CM | POA: Diagnosis not present

## 2018-05-30 DIAGNOSIS — G8929 Other chronic pain: Secondary | ICD-10-CM

## 2018-05-30 DIAGNOSIS — M25562 Pain in left knee: Secondary | ICD-10-CM

## 2018-05-30 NOTE — Patient Instructions (Signed)
Good to see you  Take tylenol 650 mg three times a day is the best evidence based medicine we have for arthritis.  Glucosamine sulfate 750mg  twice a day is a supplement that has been shown to help moderate to severe arthritis. Vitamin D 2000 IU daily Fish oil 2 grams daily.  Tumeric 500mg  twice daily.  Capsaicin topically up to four times a day may also help with pain. Please try ice on the knee. 20 minutes at a time and 3-4 times per day  Please try the exercises  Happy new Year.

## 2018-05-30 NOTE — Progress Notes (Signed)
Carmen Porter - 52 y.o. female MRN [id number]  Date of birth: 12-24-1965  SUBJECTIVE:  Including CC & ROS.  Chief Complaint  Patient presents with  . Follow-up    f/u on knee pain , R knee improved , L knee still pain/swells    Carmen Porter is a 52 y.o. female that is is following up for the left and right knee pain.  She reports the right knee having significant improvement.  Left knee pain is only mild in nature.  She does have pain intermittently but is much improved as well..  Independent review of the left knee x-ray from 9/18 shows mild medial joint narrowing of the left knee.   Review of Systems  Constitutional: Negative for fever.  HENT: Negative for congestion.   Respiratory: Negative for cough.   Cardiovascular: Negative for chest pain.  Gastrointestinal: Negative for abdominal pain.  Musculoskeletal: Negative for gait problem.  Skin: Negative for color change.    HISTORY: Past Medical, Surgical, Social, and Family History Reviewed & Updated per EMR.   Pertinent Historical Findings include:  Past Medical History:  Diagnosis Date  . Anemia   . Arthritis   . Blood transfusion without reported diagnosis     Past Surgical History:  Procedure Laterality Date  . TUBAL LIGATION      No Known Allergies  Family History  Problem Relation Age of Onset  . Stroke Father   . Hypertension Father   . Heart failure Father   . Heart disease Father   . Cancer Maternal Grandmother        cervical cancer  . Cancer Maternal Grandfather        prostate cancer  . Glaucoma Maternal Grandfather   . Breast cancer Maternal Aunt   . Colon cancer Neg Hx      Social History   Socioeconomic History  . Marital status: Married    Spouse name: Not on file  . Number of children: 4  . Years of education: Not on file  . Highest education level: Not on file  Occupational History  . Not on file  Social Needs  . Financial resource strain: Not on file  . Food insecurity:      Worry: Not on file    Inability: Not on file  . Transportation needs:    Medical: Not on file    Non-medical: Not on file  Tobacco Use  . Smoking status: Never Smoker  . Smokeless tobacco: Never Used  Substance and Sexual Activity  . Alcohol use: Yes    Comment: social  . Drug use: No  . Sexual activity: Not Currently  Lifestyle  . Physical activity:    Days per week: Not on file    Minutes per session: Not on file  . Stress: Not on file  Relationships  . Social connections:    Talks on phone: Not on file    Gets together: Not on file    Attends religious service: Not on file    Active member of club or organization: Not on file    Attends meetings of clubs or organizations: Not on file    Relationship status: Not on file  . Intimate partner violence:    Fear of current or ex partner: Not on file    Emotionally abused: Not on file    Physically abused: Not on file    Forced sexual activity: Not on file  Other Topics Concern  . Not on file  Social History Narrative  . Not on file     PHYSICAL EXAM:  VS: There were no vitals taken for this visit. Physical Exam Gen: NAD, alert, cooperative with exam, well-appearing ENT: normal lips, normal nasal mucosa,  Eye: normal EOM, normal conjunctiva and lids CV:  no edema, +2 pedal pulses   Resp: no accessory muscle use, non-labored,  Skin: no rashes, no areas of induration  Neuro: normal tone, normal sensation to touch Psych:  normal insight, alert and oriented MSK:  Left and right knee: No obvious effusion. Normal range of motion. Normal strength resistance. Neurovascular intact     ASSESSMENT & PLAN:   Chronic pain of both knees Has had significant provement in the right knee.  Could consider injections if pain occurs again or physical therapy. -Pain likely related degenerative meniscus as there was an effusion previously so focus on rehab exercises

## 2018-06-01 NOTE — Assessment & Plan Note (Signed)
Has had significant provement in the right knee.  Could consider injections if pain occurs again or physical therapy. -Pain likely related degenerative meniscus as there was an effusion previously so focus on rehab exercises

## 2018-06-12 MED FILL — MEGESTROL 40 MG TABLET: 40 | 15 days supply | Qty: 60 | Fill #1

## 2018-06-28 ENCOUNTER — Encounter: Payer: Self-pay | Admitting: Obstetrics & Gynecology

## 2018-06-28 ENCOUNTER — Ambulatory Visit (INDEPENDENT_AMBULATORY_CARE_PROVIDER_SITE_OTHER): Payer: 59 | Admitting: Obstetrics & Gynecology

## 2018-06-28 VITALS — BP 129/83 | HR 78 | Ht 68.0 in | Wt 209.0 lb

## 2018-06-28 DIAGNOSIS — D500 Iron deficiency anemia secondary to blood loss (chronic): Secondary | ICD-10-CM | POA: Diagnosis not present

## 2018-06-28 DIAGNOSIS — N939 Abnormal uterine and vaginal bleeding, unspecified: Secondary | ICD-10-CM | POA: Diagnosis not present

## 2018-06-28 NOTE — Progress Notes (Signed)
History:  53 y.o. [id number] here today for Pt had menses in Aug and then no meneses until Dec 5th to present. Pt is on the Megace and shill c/o small clots but, the bleeding is usually fairly light. Pt denies hot flushes or mood changes. She reports eating ice again.   Pt is s/p iron infusion 03/2018 and she immediately stopped eating ice.   Pt is on Megace bid. Pt prev tried the LnIUD and she had an allergic reaction.  Pt does not want to try OCPs.    The following portions of the patient's history were reviewed and updated as appropriate: allergies, current medications, past family history, past medical history, past social history, past surgical history and problem list.  Review of Systems:  Pertinent items are noted in HPI.    Objective:  Physical Exam Blood pressure 129/83, pulse 78, height 5\' 8"  (1.727 m), weight 209 lb (94.8 kg), last menstrual period 05/04/2018.  CONSTITUTIONAL: Well-developed, well-nourished female in no acute distress.  HENT:  Normocephalic, atraumatic EYES: Conjunctivae and EOM are normal. No scleral icterus.  NECK: Normal range of motion SKIN: Skin is warm and dry. No rash noted. Not diaphoretic.No pallor. [place]: Alert and oriented to person, place, and time. Normal coordination.    Labs and Imaging 04/05/2018 CLINICAL DATA:  Iron deficiency anemia due to chronic blood loss, fibroids, menorrhagia with irregular cycle  EXAM: TRANSABDOMINAL AND TRANSVAGINAL ULTRASOUND OF PELVIS  TECHNIQUE: Both transabdominal and transvaginal ultrasound examinations of the pelvis were performed. Transabdominal technique was performed for global imaging of the pelvis including uterus, ovaries, adnexal regions, and pelvic cul-de-sac. It was necessary to proceed with endovaginal exam following the transabdominal exam to visualize the endometrium.  COMPARISON:  None  FINDINGS: Uterus  Measurements: 10.8 x 7.2 x 9.7 cm = volume: 395 mL. Asymmetric thickening of  the anterior wall of the mid to upper uterus especially to the RIGHT, with slight posterior displacement of the endometrial stripe suggesting mass effect, question isoechoic leiomyoma approximately 4.1 x 4.6 x 4.9 cm versus other etiology potentially focal adenomyosis. Questionable small posterior heterogeneous and intramural nodule 11 x 8 x 14 mm, could represent a tiny leiomyoma  Endometrium  Thickness: 10 mm thick.  No endometrial fluid or focal abnormality  Right ovary  Measurements: 3.0 x 1.9 x 2.7 cm = volume: 8 mL. Normal morphology without mass  Left ovary  Measurements: 3.6 x 2.4 x 3.5 cm = volume: 16 mL. Dominant follicle 2.8 cm diameter. No additional mass.  Other findings  No free pelvic fluid or additional adnexal masses  IMPRESSION: Unremarkable ovaries  Uterus appears enlarged with asymmetric thickening of the anterior wall of the upper to mid uterus especially to the RIGHT with slight mass effect upon the endometrial stripe and heterogeneity of myometrial echogenicity, question subtle isoechoic leiomyoma up to 4.9 cm diameter versus potentially focal adenomyosis.  Additional question small posterior intramural leiomyoma 14 mm diameter  If clinically indicated, uterus could be better assessed by MR to exclude focal mass.  Assessment & Plan:  AUB- suspect perimenopausal sx. Pt not want to try OCPs. Review options for treatment . Pt requests ablation. Declines further medical management   Patient desires surgical management with hysteroscopy with endometrial ablation.  The risks of surgery were discussed in detail with the patient including but not limited to: bleeding which may require transfusion or reoperation; perforation, infection which may require prolonged hospitalization or re-hospitalization and antibiotic therapy; injury to bowel, bladder, ureters and major vessels or  other surrounding organs; need for additional procedures including  laparotomy; thromboembolic phenomenon, incisional problems and other postoperative or anesthesia complications.  Patient was told that the likelihood that her condition and symptoms will be treated effectively with this surgical management was very high; the postoperative expectations were also discussed in detail. The patient also understands the alternative treatment options which were discussed in full. All questions were answered.  She was told that she will be contacted by our surgical scheduler regarding the time and date of her surgery; routine preoperative instructions of having nothing to eat or drink after midnight on the day prior to surgery and also coming to the hospital 1 1/2 hours prior to her time of surgery were also emphasized.  She was told she may be called for a preoperative appointment about a week prior to surgery and will be given further preoperative instructions at that visit. Printed patient education handouts about the procedure were given to the patient to review at home.  Megace 80 mg po bid CBC today  Nickolette Espinola L. Harraway-Smith, M.D., Cherlynn June

## 2018-06-28 NOTE — Patient Instructions (Signed)
Endometrial Ablation Endometrial ablation is a procedure that destroys the thin inner layer of the lining of the uterus (endometrium). This procedure may be done:  To stop heavy periods.  To stop bleeding that is causing anemia.  To control irregular bleeding.  To treat bleeding caused by small tumors (fibroids) in the endometrium. This procedure is often an alternative to major surgery, such as removal of the uterus and cervix (hysterectomy). As a result of this procedure:  You may not be able to have children. However, if you are premenopausal (you have not gone through menopause): ? You may still have a small chance of getting pregnant. ? You will need to use a reliable method of birth control after the procedure to prevent pregnancy.  You may stop having a menstrual period, or you may have only a small amount of bleeding during your period. Menstruation may return several years after the procedure. Tell a health care provider about:  Any allergies you have.  All medicines you are taking, including vitamins, herbs, eye drops, creams, and over-the-counter medicines.  Any problems you or family members have had with the use of anesthetic medicines.  Any blood disorders you have.  Any surgeries you have had.  Any medical conditions you have. What are the risks? Generally, this is a safe procedure. However, problems may occur, including:  A hole (perforation) in the uterus or bowel.  Infection of the uterus, bladder, or vagina.  Bleeding.  Damage to other structures or organs.  An air bubble in the lung (air embolus).  Problems with pregnancy after the procedure.  Failure of the procedure.  Decreased ability to diagnose cancer in the endometrium. What happens before the procedure?  You will have tests of your endometrium to make sure there are no pre-cancerous cells or cancer cells present.  You may have an ultrasound of the uterus.  You may be given medicines to  thin the endometrium.  Ask your health care provider about: ? Changing or stopping your regular medicines. This is especially important if you take diabetes medicines or blood thinners. ? Taking medicines such as aspirin and ibuprofen. These medicines can thin your blood. Do not take these medicines before your procedure if your doctor tells you not to.  Plan to have someone take you home from the hospital or clinic. What happens during the procedure?   You will lie on an exam table with your feet and legs supported as in a pelvic exam.  To lower your risk of infection: ? Your health care team will wash or sanitize their hands and put on germ-free (sterile) gloves. ? Your genital area will be washed with soap.  An IV tube will be inserted into one of your veins.  You will be given a medicine to help you relax (sedative).  A surgical instrument with a light and camera (resectoscope) will be inserted into your vagina and moved into your uterus. This allows your surgeon to see inside your uterus.  Endometrial tissue will be removed using one of the following methods: ? Radiofrequency. This method uses a radiofrequency-alternating electric current to remove the endometrium. ? Cryotherapy. This method uses extreme cold to freeze the endometrium. ? Heated-free liquid. This method uses a heated saltwater (saline) solution to remove the endometrium. ? Microwave. This method uses high-energy microwaves to heat up the endometrium and remove it. ? Thermal balloon. This method involves inserting a catheter with a balloon tip into the uterus. The balloon tip is filled with   method uses a heated saltwater (saline) solution to remove the endometrium.  ? Microwave. This method uses high-energy microwaves to heat up the endometrium and remove it.  ? Thermal balloon. This method involves inserting a catheter with a balloon tip into the uterus. The balloon tip is filled with heated fluid to remove the endometrium.  The procedure may vary among health care providers and hospitals.  What happens after the procedure?  · Your blood pressure, heart rate, breathing rate, and blood oxygen level will be monitored until the medicines you were given have worn off.  · As tissue healing occurs, you may notice  vaginal bleeding for 4-6 weeks after the procedure. You may also experience:  ? Cramps.  ? Thin, watery vaginal discharge that is light pink or brown in color.  ? A need to urinate more frequently than usual.  ? Nausea.  · Do not drive for 24 hours if you were given a sedative.  · Do not have sex or insert anything into your vagina until your health care provider approves.  Summary  · Endometrial ablation is done to treat the many causes of heavy menstrual bleeding.  · The procedure may be done only after medications have been tried to control the bleeding.  · Plan to have someone take you home from the hospital or clinic.  This information is not intended to replace advice given to you by your health care provider. Make sure you discuss any questions you have with your health care provider.  Document Released: 03/26/2004 Document Revised: 11/01/2017 Document Reviewed: 06/03/2016  [employer]vier Interactive Patient Education © 2019 [employer]

## 2018-06-29 ENCOUNTER — Encounter (HOSPITAL_COMMUNITY): Payer: Self-pay

## 2018-06-29 LAB — CBC
HEMATOCRIT: 28.6 % — AB (ref 34.0–46.6)
HEMOGLOBIN: 9.3 g/dL — AB (ref 11.1–15.9)
MCH: 26 pg — ABNORMAL LOW (ref 26.6–33.0)
MCHC: 32.5 g/dL (ref 31.5–35.7)
MCV: 80 fL (ref 79–97)
Platelets: 258 10*3/uL (ref 150–450)
RBC: 3.58 x10E6/uL — ABNORMAL LOW (ref 3.77–5.28)
RDW: 14.2 % (ref 11.7–15.4)
WBC: 4.9 10*3/uL (ref 3.4–10.8)

## 2018-07-11 MED FILL — FOLIC ACID 1 MG TABS: 1 | 30 days supply | Qty: 30 | Fill #1

## 2018-08-16 ENCOUNTER — Telehealth: Payer: Self-pay

## 2018-08-16 NOTE — Telephone Encounter (Signed)
Called, spoke to patient, rescheduled surgery date and time given

## 2018-09-06 ENCOUNTER — Encounter: Payer: 59 | Admitting: Obstetrics & Gynecology

## 2018-10-05 DIAGNOSIS — N939 Abnormal uterine and vaginal bleeding, unspecified: Secondary | ICD-10-CM

## 2018-10-18 ENCOUNTER — Ambulatory Visit (INDEPENDENT_AMBULATORY_CARE_PROVIDER_SITE_OTHER): Payer: 59 | Admitting: Obstetrics & Gynecology

## 2018-10-18 ENCOUNTER — Encounter: Payer: Self-pay | Admitting: Obstetrics & Gynecology

## 2018-10-18 ENCOUNTER — Telehealth: Payer: Self-pay | Admitting: General Practice

## 2018-10-18 ENCOUNTER — Other Ambulatory Visit: Payer: Self-pay

## 2018-10-18 VITALS — Ht 67.0 in | Wt 209.0 lb

## 2018-10-18 DIAGNOSIS — N939 Abnormal uterine and vaginal bleeding, unspecified: Secondary | ICD-10-CM

## 2018-10-18 NOTE — Progress Notes (Addendum)
TELEHEALTH VIRTUAL GYNECOLOGY VISIT ENCOUNTER NOTE  I connected with Carmen Porter on 10/18/18 at  2:00 PM EDT by telephone at home and verified that I am speaking with the correct person using two identifiers.   I discussed the limitations, risks, security and privacy concerns of performing an evaluation and management service by telephone and the availability of in person appointments. I also discussed with the patient that there may be a patient responsible charge related to this service. The patient expressed understanding and agreed to proceed.   History:  Carmen Porter is a 53 y.o. [phone] female being evaluated today for AUB. She denies any abnormal vaginal discharge or pelvic pain. She remains anemic and has continued heavy bleeding but, it is improved on Megace .  Pt denies bleeding since Feb on the Megace. Pt does not want to stay on the meds.       Past Medical History:  Diagnosis Date  . Anemia   . Arthritis   . Blood transfusion without reported diagnosis    Past Surgical History:  Procedure Laterality Date  . TUBAL LIGATION     The following portions of the patient's history were reviewed and updated as appropriate: allergies, current medications, past family history, past medical history, past social history, past surgical history and problem list.   Health Maintenance:  Normal pap and negative HRHPV on 02/23/2018.  Normal mammogram on 04/05/2018   Review of Systems:  Pertinent items noted in HPI and remainder of comprehensive ROS otherwise negative.  Physical Exam:   General:  Alert, oriented and cooperative.   Mental Status: Normal mood and affect perceived. Normal judgment and thought content.  Physical exam deferred due to nature of the encounter  Labs and Imaging 04/05/2018 CLINICAL DATA:  Iron deficiency anemia due to chronic blood loss, fibroids, menorrhagia with irregular cycle  EXAM: TRANSABDOMINAL AND TRANSVAGINAL ULTRASOUND OF PELVIS   TECHNIQUE: Both transabdominal and transvaginal ultrasound examinations of the pelvis were performed. Transabdominal technique was performed for global imaging of the pelvis including uterus, ovaries, adnexal regions, and pelvic cul-de-sac. It was necessary to proceed with endovaginal exam following the transabdominal exam to visualize the endometrium.  COMPARISON:  None  FINDINGS: Uterus  Measurements: 10.8 x 7.2 x 9.7 cm = volume: 395 mL. Asymmetric thickening of the anterior wall of the mid to upper uterus especially to the RIGHT, with slight posterior displacement of the endometrial stripe suggesting mass effect, question isoechoic leiomyoma approximately 4.1 x 4.6 x 4.9 cm versus other etiology potentially focal adenomyosis. Questionable small posterior heterogeneous and intramural nodule 11 x 8 x 14 mm, could represent a tiny leiomyoma  Endometrium  Thickness: 10 mm thick.  No endometrial fluid or focal abnormality  Right ovary  Measurements: 3.0 x 1.9 x 2.7 cm = volume: 8 mL. Normal morphology without mass  Left ovary  Measurements: 3.6 x 2.4 x 3.5 cm = volume: 16 mL. Dominant follicle 2.8 cm diameter. No additional mass.  Other findings  No free pelvic fluid or additional adnexal masses  IMPRESSION: Unremarkable ovaries  Uterus appears enlarged with asymmetric thickening of the anterior wall of the upper to mid uterus especially to the RIGHT with slight mass effect upon the endometrial stripe and heterogeneity of myometrial echogenicity, question subtle isoechoic leiomyoma up to 4.9 cm diameter versus potentially focal adenomyosis.  Additional question small posterior intramural leiomyoma 14 mm diameter  If clinically indicated, uterus could be better assessed by MR to exclude focal mass.  Assessment  and Plan:     AUB  Patient desires surgical management with hysteroscopy with dilation and currettage and biopsies and endometrial  ablation. The risks of surgery were discussed in detail with the patient including but not limited to: bleeding which may require transfusion or reoperation; infection which may require prolonged hospitalization or re-hospitalization and antibiotic therapy; injury to bowel, bladder, ureters and major vessels or other surrounding organs; need for additional procedures including laparotomy; thromboembolic phenomenon, incisional problems and other postoperative or anesthesia complications.  Patient was told that the likelihood that her condition and symptoms will be treated effectively with this surgical management was very high; the postoperative expectations were also discussed in detail. The patient also understands the alternative treatment options which were discussed in full. All questions were answered.  She was told that she will be contacted by our surgical scheduler regarding the time and date of her surgery; routine preoperative instructions of having nothing to eat or drink after midnight on the day prior to surgery and also coming to the hospital 1 1/2 hours prior to her time of surgery were also emphasized.  She was told she may be called for a preoperative appointment about a week prior to surgery and will be given further preoperative instructions at that visit. Printed patient education handouts about the procedure were given to the patient to review at home.     I discussed the assessment and treatment plan with the patient. The patient was provided an opportunity to ask questions and all were answered. The patient agreed with the plan and demonstrated an understanding of the instructions.   The patient was advised to call back or seek an in-person evaluation/go to the ED if the symptoms worsen or if the condition fails to improve as anticipated.  I provided 15 minutes of non-face-to-face time during this encounter.  Lavonia Drafts, MD [facility], [place]

## 2018-10-18 NOTE — H&P (Addendum)
Preoperative History and Physical  Carmen Porter is a 53 y.o. [id number] here for surgical management of AUB.   Proposed surgery: hysteroscopy with dilation and curettage and endometrial ablation using Novasure.   Past Medical History:  Diagnosis Date  . Anemia   . Arthritis   . Blood transfusion without reported diagnosis   AUB  Past Surgical History:  Procedure Laterality Date  . TUBAL LIGATION     OB History    Gravida  5   Para  4   Term  4   Preterm      AB  1   Living  4     SAB      TAB  1   Ectopic      Multiple      Live Births  4          Patient denies any cervical dysplasia or STIs. No medications prior to admission.    No Known Allergies Social History:   reports that she has never smoked. She has never used smokeless tobacco. She reports current alcohol use. She reports that she does not use drugs. Family History  Problem Relation Age of Onset  . Stroke Father   . Hypertension Father   . Heart failure Father   . Heart disease Father   . Cancer Maternal Grandmother        cervical cancer  . Cancer Maternal Grandfather        prostate cancer  . Glaucoma Maternal Grandfather   . Breast cancer Maternal Aunt   . Colon cancer Neg Hx     Review of Systems: Noncontributory  PHYSICAL EXAM: BP (!) 168/97   Pulse 66   Temp 98.5 F (36.9 C) (Oral)   Resp 16   SpO2 100%   General appearance - alert, well appearing, and in no distress Chest - clear to auscultation, no wheezes, rales or rhonchi, symmetric air entry Heart - normal rate and regular rhythm Abdomen - soft, nontender, nondistended, no masses or organomegaly Pelvic - examination not indicated Extremities - peripheral pulses normal, no pedal edema, no clubbing or cyanosis  Labs:  CBC Latest Ref Rng & Units 11/06/2018 06/28/2018 04/12/2018  WBC 4.0 - 10.5 K/uL 4.7 4.9 4.7  Hemoglobin 12.0 - 15.0 g/dL 11.1(L) 9.3(L) 10.5(L)  Hematocrit 36.0 - 46.0 % 37.6 28.6(L) 33.3(L)   Platelets 150 - 400 K/uL 219 258 229.0   Imaging Studies: 04/05/2018 CLINICAL DATA:  Iron deficiency anemia due to chronic blood loss, fibroids, menorrhagia with irregular cycle  EXAM: TRANSABDOMINAL AND TRANSVAGINAL ULTRASOUND OF PELVIS  TECHNIQUE: Both transabdominal and transvaginal ultrasound examinations of the pelvis were performed. Transabdominal technique was performed for global imaging of the pelvis including uterus, ovaries, adnexal regions, and pelvic cul-de-sac. It was necessary to proceed with endovaginal exam following the transabdominal exam to visualize the endometrium.  COMPARISON:  None  FINDINGS: Uterus  Measurements: 10.8 x 7.2 x 9.7 cm = volume: 395 mL. Asymmetric thickening of the anterior wall of the mid to upper uterus especially to the RIGHT, with slight posterior displacement of the endometrial stripe suggesting mass effect, question isoechoic leiomyoma approximately 4.1 x 4.6 x 4.9 cm versus other etiology potentially focal adenomyosis. Questionable small posterior heterogeneous and intramural nodule 11 x 8 x 14 mm, could represent a tiny leiomyoma  Endometrium  Thickness: 10 mm thick.  No endometrial fluid or focal abnormality  Right ovary  Measurements: 3.0 x 1.9 x 2.7 cm = volume: 8 mL. Normal  morphology without mass  Left ovary  Measurements: 3.6 x 2.4 x 3.5 cm = volume: 16 mL. Dominant follicle 2.8 cm diameter. No additional mass.  Other findings  No free pelvic fluid or additional adnexal masses  IMPRESSION: Unremarkable ovaries  Uterus appears enlarged with asymmetric thickening of the anterior wall of the upper to mid uterus especially to the RIGHT with slight mass effect upon the endometrial stripe and heterogeneity of myometrial echogenicity, question subtle isoechoic leiomyoma up to 4.9 cm diameter versus potentially focal adenomyosis.  Additional question small posterior intramural leiomyoma 14  mm diameter  If clinically indicated, uterus could be better assessed by MR to exclude focal mass.  Assessment: Patient Active Problem List   Diagnosis Date Noted  . Abnormal uterine bleeding (AUB) 10/05/2018  . Chronic pain of both knees 04/26/2018  . Pain in both lower extremities 02/23/2018  . Constipation 02/23/2018  . External hemorrhoids without complication 54/27/0623  . Left wrist pain 10/06/2012  . Thyromegaly 10/06/2012  . Routine general medical examination at a health care facility 10/06/2012  . Iron deficiency anemia due to chronic blood loss 09/23/2011  . Menorrhagia 09/23/2011    Plan: Patient will undergo surgical management with hysteroscopy with dilation and curettage and endometrial ablation using Novasure.  The risks of surgery were discussed in detail with the patient including but not limited to: bleeding which may require transfusion or reoperation; infection which may require antibiotics; injury to surrounding organs which may involve bowel, bladder, ureters ; need for additional procedures including laparoscopy or laparotomy; thromboembolic phenomenon, surgical site problems and other postoperative/anesthesia complications. Likelihood of success in alleviating the patient's condition was discussed. Routine postoperative instructions will be reviewed with the patient and her family in detail after surgery.  The patient concurred with the proposed plan, giving informed written consent for the surgery.  Patient has been NPO since last night she will remain NPO for procedure.  Anesthesia and OR aware.  Preoperative prophylactic antibiotics and SCDs ordered on call to the OR.  To OR when ready.  Chinyere Galiano L. Ihor Dow, M.D., [facility] 10/18/2018 1:58 PM

## 2018-10-18 NOTE — Telephone Encounter (Signed)
Left message in regards to post op appt with Dr. Ihor Dow on 11/16/2018 at 11:00am via Webex.  Asked pt to give our office a call back with any questions or concerns.  Mychart message sent as well.

## 2018-10-18 NOTE — Progress Notes (Signed)
Patient identified by name and dob. Patient agrees to telephone visit. (attempted webex but patient had trouble downloading the app). Patient complaining of hot flashes. Kathrene Alu RN

## 2018-11-03 NOTE — Pre-Procedure Instructions (Signed)
Carmen Porter, Carmen Porter Phone: [phone] Fax: [phone]      Your procedure is scheduled on  11-07-18 Tuesday from [id number]  Report to [place] Entrance "A" at  [place].M., and check in at the Admitting office.  Call this number if you have problems the morning of surgery:  [phone]  Call [phone] if you have any questions prior to your surgery date Monday-Friday 8am-4pm    Remember:  Do not eat or drink after midnight.    Take these medicines the morning of surgery with A SIP OF WATER :none  As of todaySTOP taking any  MOBIC, Aspirin (unless otherwise instructed by your surgeon), Aleve, Naproxen, Ibuprofen, Motrin, Advil, Goody's, BC's, all herbal medications, fish oil, and all vitamins.    The Morning of Surgery  Do not wear jewelry, make-up or nail polish.  Do not wear lotions, powders, or perfumes/colognes, or deodorant  Do not shave 48 hours prior to surgery.  Men may shave face and neck.  Do not bring valuables to the hospital.  [facility] is not responsible for any belongings or valuables.  If you are a smoker, DO NOT Smoke 24 hours prior to surgery IF you wear a CPAP at night please bring your mask, tubing, and machine the morning of surgery   Remember that you must have someone to transport you home after your surgery, and remain with you for 24 hours if you are discharged the same day.   Contacts, glasses, hearing aids, dentures or bridgework may not be worn into surgery.    Leave your suitcase in the car.  After surgery it may be brought to your room.  For patients admitted to the hospital, discharge time will be determined by your treatment team.  Patients discharged the day of surgery will not be allowed to drive home.    Special instructions:   [place]- Preparing For Surgery  Before surgery, you can play an important  role. Because skin is not sterile, your skin needs to be as free of germs as possible. You can reduce the number of germs on your skin by washing with CHG (chlorahexidine gluconate) Soap before surgery.  CHG is an antiseptic cleaner which kills germs and bonds with the skin to continue killing germs even after washing.    Oral Hygiene is also important to reduce your risk of infection.  Remember - BRUSH YOUR TEETH THE MORNING OF SURGERY WITH YOUR REGULAR TOOTHPASTE  Please do not use if you have an allergy to CHG or antibacterial soaps. If your skin becomes reddened/irritated stop using the CHG.  Do not shave (including legs and underarms) for at least 48 hours prior to first CHG shower. It is OK to shave your face.  Please follow these instructions carefully.   1. Shower the NIGHT BEFORE SURGERY and the MORNING OF SURGERY with CHG Soap.   2. If you chose to wash your hair, wash your hair first as usual with your normal shampoo.  3. After you shampoo, rinse your hair and body thoroughly to remove the shampoo.  4. Use CHG as you would any other liquid soap. You can apply CHG directly to the skin and wash gently with a scrungie or a clean washcloth.   5. Apply the CHG Soap to your body ONLY FROM THE NECK DOWN.  Do not use on open wounds or  open sores. Avoid contact with your eyes, ears, mouth and genitals (private parts). Wash Face and genitals (private parts)  with your normal soap.   6. Wash thoroughly, paying special attention to the area where your surgery will be performed.  7. Thoroughly rinse your body with warm water from the neck down.  8. DO NOT shower/wash with your normal soap after using and rinsing off the CHG Soap.  9. Pat yourself dry with a CLEAN TOWEL.  10. Wear CLEAN PAJAMAS to bed the night before surgery, wear comfortable clothes the morning of surgery  11. Place CLEAN SHEETS on your bed the night of your first shower and DO NOT SLEEP WITH PETS.    Day of  Surgery:  Do not apply any deodorants/lotions.  Please wear clean clothes to the hospital/surgery center.   Remember to brush your teeth WITH YOUR REGULAR TOOTHPASTE.   Please read over the following fact sheets that you were given.

## 2018-11-06 ENCOUNTER — Encounter (HOSPITAL_COMMUNITY): Payer: Self-pay

## 2018-11-06 ENCOUNTER — Other Ambulatory Visit (HOSPITAL_COMMUNITY)
Admission: RE | Admit: 2018-11-06 | Discharge: 2018-11-06 | Disposition: A | Discharge status: Home / Self Care | Payer: 59 | Source: Ambulatory Visit | Attending: Obstetrics & Gynecology | Admitting: Obstetrics & Gynecology

## 2018-11-06 ENCOUNTER — Encounter (HOSPITAL_COMMUNITY)
Admission: RE | Admit: 2018-11-06 | Discharge: 2018-11-06 | Disposition: A | Discharge status: Home / Self Care | Payer: 59 | Source: Ambulatory Visit | Attending: Obstetrics & Gynecology | Admitting: Obstetrics & Gynecology

## 2018-11-06 ENCOUNTER — Other Ambulatory Visit: Payer: Self-pay

## 2018-11-06 DIAGNOSIS — Z8042 Family history of malignant neoplasm of prostate: Secondary | ICD-10-CM | POA: Diagnosis not present

## 2018-11-06 DIAGNOSIS — N920 Excessive and frequent menstruation with regular cycle: Secondary | ICD-10-CM | POA: Diagnosis not present

## 2018-11-06 DIAGNOSIS — Z820 Family history of epilepsy and other diseases of the nervous system: Secondary | ICD-10-CM | POA: Diagnosis not present

## 2018-11-06 DIAGNOSIS — Z8049 Family history of malignant neoplasm of other genital organs: Secondary | ICD-10-CM | POA: Diagnosis not present

## 2018-11-06 DIAGNOSIS — M1990 Unspecified osteoarthritis, unspecified site: Secondary | ICD-10-CM | POA: Diagnosis not present

## 2018-11-06 DIAGNOSIS — Z823 Family history of stroke: Secondary | ICD-10-CM | POA: Diagnosis not present

## 2018-11-06 DIAGNOSIS — Z01812 Encounter for preprocedural laboratory examination: Secondary | ICD-10-CM | POA: Insufficient documentation

## 2018-11-06 DIAGNOSIS — Z8249 Family history of ischemic heart disease and other diseases of the circulatory system: Secondary | ICD-10-CM | POA: Diagnosis not present

## 2018-11-06 DIAGNOSIS — Z1159 Encounter for screening for other viral diseases: Secondary | ICD-10-CM | POA: Diagnosis not present

## 2018-11-06 DIAGNOSIS — N840 Polyp of corpus uteri: Secondary | ICD-10-CM | POA: Diagnosis not present

## 2018-11-06 DIAGNOSIS — E669 Obesity, unspecified: Secondary | ICD-10-CM | POA: Diagnosis not present

## 2018-11-06 HISTORY — DX: Other complications of anesthesia, initial encounter: T88.59XA

## 2018-11-06 LAB — CBC
HCT: 37.6 % (ref 36.0–46.0)
Hemoglobin: 11.1 g/dL — ABNORMAL LOW (ref 12.0–15.0)
MCH: 24.3 pg — ABNORMAL LOW (ref 26.0–34.0)
MCHC: 29.5 g/dL — ABNORMAL LOW (ref 30.0–36.0)
MCV: 82.5 fL (ref 80.0–100.0)
Platelets: 219 10*3/uL (ref 150–400)
RBC: 4.56 MIL/uL (ref 3.87–5.11)
RDW: 16.6 % — ABNORMAL HIGH (ref 11.5–15.5)
WBC: 4.7 10*3/uL (ref 4.0–10.5)
nRBC: 0 % (ref 0.0–0.2)

## 2018-11-06 LAB — BASIC METABOLIC PANEL
Anion gap: 12 (ref 5–15)
BUN: 10 mg/dL (ref 6–20)
CO2: 22 mmol/L (ref 22–32)
Calcium: 9.8 mg/dL (ref 8.9–10.3)
Chloride: 107 mmol/L (ref 98–111)
Creatinine, Ser: 0.68 mg/dL (ref 0.44–1.00)
GFR calc Af Amer: >60 mL/min (ref >60)
GFR calc non Af Amer: >60 mL/min (ref >60)
Glucose, Bld: 111 mg/dL — ABNORMAL HIGH (ref 70–99)
Potassium: 3.5 mmol/L (ref 3.5–5.1)
Sodium: 141 mmol/L (ref 135–145)

## 2018-11-06 LAB — SARS CORONAVIRUS 2 BY RT PCR (HOSPITAL ORDER, PERFORMED IN ~~LOC~~ HOSPITAL LAB): SARS Coronavirus 2: NEGATIVE

## 2018-11-06 NOTE — Pre-Procedure Instructions (Signed)
Carmen Porter, [name] [address] Phone: [phone] Fax: [phone]    Your procedure is scheduled on  11-07-18 Tuesday from [id number]  Report to [place] Entrance "A" at  [place].M., and check in at the Admitting office.  Call this number if you have problems the morning of surgery:  [phone]  Call [phone] if you have any questions prior to your surgery date Monday-Friday 8am-4pm   Remember:  Do not eat or drink after midnight.   Take these medicines the morning of surgery with A SIP OF WATER :none  If needed - gabapentin (NEURONTIN)   As of todaySTOP taking any  MOBIC, Aspirin (unless otherwise instructed by your surgeon), Aleve, Naproxen, Ibuprofen, Motrin, Advil, Goody's, BC's, all herbal medications, fish oil, and all vitamins.  The Morning of Surgery  Do not wear jewelry, make-up or nail polish.  Do not wear lotions, powders, or perfumes/colognes, or deodorant  Do not shave 48 hours prior to surgery.  Men may shave face and neck.  Do not bring valuables to the hospital.  [facility] is not responsible for any belongings or valuables.  If you are a smoker, DO NOT Smoke 24 hours prior to surgery IF you wear a CPAP at night please bring your mask, tubing, and machine the morning of surgery   Remember that you must have someone to transport you home after your surgery, and remain with you for 24 hours if you are discharged the same day.  Contacts, glasses, hearing aids, dentures or bridgework may not be worn into surgery.   Leave your suitcase in the car.  After surgery it may be brought to your room.  For patients admitted to the hospital, discharge time will be determined by your treatment team.  Patients discharged the day of surgery will not be allowed to drive home.   Special instructions:   [place]- Preparing For Surgery  Before surgery, you  can play an important role. Because skin is not sterile, your skin needs to be as free of germs as possible. You can reduce the number of germs on your skin by washing with CHG (chlorahexidine gluconate) Soap before surgery.  CHG is an antiseptic cleaner which kills germs and bonds with the skin to continue killing germs even after washing.    Oral Hygiene is also important to reduce your risk of infection.  Remember - BRUSH YOUR TEETH THE MORNING OF SURGERY WITH YOUR REGULAR TOOTHPASTE  Please do not use if you have an allergy to CHG or antibacterial soaps. If your skin becomes reddened/irritated stop using the CHG.  Do not shave (including legs and underarms) for at least 48 hours prior to first CHG shower. It is OK to shave your face.  Please follow these instructions carefully.   1. Shower the NIGHT BEFORE SURGERY and the MORNING OF SURGERY with CHG Soap.   2. If you chose to wash your hair, wash your hair first as usual with your normal shampoo.  3. After you shampoo, rinse your hair and body thoroughly to remove the shampoo.  4. Use CHG as you would any other liquid soap. You can apply CHG directly to the skin and wash gently with a scrungie or a clean washcloth.   5. Apply the CHG Soap to your body ONLY FROM THE NECK DOWN.  Do not use on open wounds or open sores.  Avoid contact with your eyes, ears, mouth and genitals (private parts). Wash Face and genitals (private parts)  with your normal soap.   6. Wash thoroughly, paying special attention to the area where your surgery will be performed.  7. Thoroughly rinse your body with warm water from the neck down.  8. DO NOT shower/wash with your normal soap after using and rinsing off the CHG Soap.  9. Pat yourself dry with a CLEAN TOWEL.  10. Wear CLEAN PAJAMAS to bed the night before surgery, wear comfortable clothes the morning of surgery  11. Place CLEAN SHEETS on your bed the night of your first shower and DO NOT SLEEP WITH  PETS.    Day of Surgery:  Do not apply any deodorants/lotions.  Please wear clean clothes to the hospital/surgery center.   Remember to brush your teeth WITH YOUR REGULAR TOOTHPASTE.   Please read over the following fact sheets that you were given.

## 2018-11-06 NOTE — Progress Notes (Signed)
PCP - Flossie Buffy, NP Cardiologist - denies  Chest x-ray - denies EKG - denies Stress Test - denies  ECHO - denies Cardiac Cath - denies  Sleep Study - denies CPAP - N/A  Blood Thinner Instructions: N/A Aspirin Instructions: N/A  Anesthesia review: No  Coronavirus Screening  Have you experienced the following symptoms:  Cough yes/no: No Fever (>100.23F)  yes/no: No Runny nose yes/no: No Sore throat yes/no: No Difficulty breathing/shortness of breath  yes/no: No  Have you or a family member traveled in the last 14 days and where? yes/no: No  If the patient indicates "YES" to the above questions, their PAT will be rescheduled to limit the exposure to others and, the surgeon will be notified. THE PATIENT WILL NEED TO BE ASYMPTOMATIC FOR 14 DAYS.   If the patient is not experiencing any of these symptoms, the PAT nurse will instruct them to NOT bring anyone with them to their appointment since they may have these symptoms or traveled as well.   Please remind your patients and families that hospital visitation restrictions are in effect and the importance of the restrictions.   Patient denies shortness of breath, fever, cough and chest pain at PAT appointment  Patient verbalized understanding of instructions that were given to them at the PAT appointment. Patient was also instructed that they will need to review over the PAT instructions again at home before surgery.

## 2018-11-07 ENCOUNTER — Ambulatory Visit (HOSPITAL_COMMUNITY)
Admission: RE | Admit: 2018-11-07 | Discharge: 2018-11-07 | Disposition: A | Discharge status: Home / Self Care | Payer: 59 | Attending: Obstetrics & Gynecology | Admitting: Obstetrics & Gynecology

## 2018-11-07 ENCOUNTER — Ambulatory Visit (HOSPITAL_COMMUNITY): Payer: 59 | Admitting: Certified Registered Nurse Anesthetist

## 2018-11-07 ENCOUNTER — Other Ambulatory Visit: Payer: Self-pay

## 2018-11-07 ENCOUNTER — Encounter (HOSPITAL_COMMUNITY): Payer: Self-pay

## 2018-11-07 ENCOUNTER — Encounter (HOSPITAL_COMMUNITY)
Admission: RE | Disposition: A | Discharge status: Home / Self Care | Payer: Self-pay | Source: Home / Self Care | Attending: Obstetrics & Gynecology

## 2018-11-07 DIAGNOSIS — D500 Iron deficiency anemia secondary to blood loss (chronic): Secondary | ICD-10-CM | POA: Diagnosis not present

## 2018-11-07 DIAGNOSIS — G8929 Other chronic pain: Secondary | ICD-10-CM

## 2018-11-07 DIAGNOSIS — Z823 Family history of stroke: Secondary | ICD-10-CM | POA: Diagnosis not present

## 2018-11-07 DIAGNOSIS — M1990 Unspecified osteoarthritis, unspecified site: Secondary | ICD-10-CM | POA: Insufficient documentation

## 2018-11-07 DIAGNOSIS — Z1159 Encounter for screening for other viral diseases: Secondary | ICD-10-CM | POA: Diagnosis not present

## 2018-11-07 DIAGNOSIS — Z8249 Family history of ischemic heart disease and other diseases of the circulatory system: Secondary | ICD-10-CM | POA: Diagnosis not present

## 2018-11-07 DIAGNOSIS — N920 Excessive and frequent menstruation with regular cycle: Secondary | ICD-10-CM | POA: Diagnosis present

## 2018-11-07 DIAGNOSIS — E669 Obesity, unspecified: Secondary | ICD-10-CM | POA: Insufficient documentation

## 2018-11-07 DIAGNOSIS — Z820 Family history of epilepsy and other diseases of the nervous system: Secondary | ICD-10-CM | POA: Insufficient documentation

## 2018-11-07 DIAGNOSIS — E010 Iodine-deficiency related diffuse (endemic) goiter: Secondary | ICD-10-CM | POA: Diagnosis present

## 2018-11-07 DIAGNOSIS — Z8049 Family history of malignant neoplasm of other genital organs: Secondary | ICD-10-CM | POA: Diagnosis not present

## 2018-11-07 DIAGNOSIS — N840 Polyp of corpus uteri: Secondary | ICD-10-CM | POA: Insufficient documentation

## 2018-11-07 DIAGNOSIS — N939 Abnormal uterine and vaginal bleeding, unspecified: Secondary | ICD-10-CM

## 2018-11-07 DIAGNOSIS — Z8042 Family history of malignant neoplasm of prostate: Secondary | ICD-10-CM | POA: Insufficient documentation

## 2018-11-07 HISTORY — PX: DILITATION & CURRETTAGE/HYSTROSCOPY WITH NOVASURE ABLATION: SHX5568

## 2018-11-07 LAB — POCT PREGNANCY, URINE: Preg Test, Ur: NEGATIVE

## 2018-11-07 SURGERY — DILATATION & CURETTAGE/HYSTEROSCOPY WITH NOVASURE ABLATION
Anesthesia: General | Site: Vagina

## 2018-11-07 MED ORDER — MIDAZOLAM HCL 5 MG/5ML IJ SOLN
INTRAMUSCULAR | Status: DC | PRN
  Administered 2018-11-07: 2 mg via INTRAVENOUS

## 2018-11-07 MED ORDER — FENTANYL CITRATE (PF) 100 MCG/2ML IJ SOLN
INTRAMUSCULAR | Status: DC | PRN
  Administered 2018-11-07: 50 ug via INTRAVENOUS

## 2018-11-07 MED ORDER — DEXAMETHASONE SODIUM PHOSPHATE 4 MG/ML IJ SOLN
INTRAMUSCULAR | Status: DC | PRN
  Administered 2018-11-07: 5 mg via INTRAVENOUS

## 2018-11-07 MED ORDER — PROPOFOL 10 MG/ML IV BOLUS
INTRAVENOUS | Status: AC
  Filled 2018-11-07: qty 20

## 2018-11-07 MED ORDER — KETOROLAC TROMETHAMINE 15 MG/ML IJ SOLN
15.0000 mg | INTRAMUSCULAR | Status: AC
  Administered 2018-11-07: 08:00:00 15 mg via INTRAVENOUS

## 2018-11-07 MED ORDER — BUPIVACAINE HCL (PF) 0.5 % IJ SOLN
INTRAMUSCULAR | Status: DC | PRN
  Administered 2018-11-07: 20 mL

## 2018-11-07 MED ORDER — LIDOCAINE 2% (20 MG/ML) 5 ML SYRINGE
INTRAMUSCULAR | Status: DC | PRN
  Administered 2018-11-07: 100 mg via INTRAVENOUS

## 2018-11-07 MED ORDER — MIDAZOLAM HCL 2 MG/2ML IJ SOLN
INTRAMUSCULAR | Status: AC
  Filled 2018-11-07: qty 2

## 2018-11-07 MED ORDER — GABAPENTIN 300 MG PO CAPS
300.0000 mg | ORAL_CAPSULE | ORAL | Status: AC
  Administered 2018-11-07: 08:00:00 300 mg via ORAL

## 2018-11-07 MED ORDER — DIPHENHYDRAMINE HCL 50 MG/ML IJ SOLN
INTRAMUSCULAR | Status: DC | PRN
  Administered 2018-11-07: 12.5 mg via INTRAVENOUS

## 2018-11-07 MED ORDER — PROPOFOL 10 MG/ML IV BOLUS
INTRAVENOUS | Status: DC | PRN
  Administered 2018-11-07: 150 mg via INTRAVENOUS

## 2018-11-07 MED ORDER — ONDANSETRON HCL 4 MG/2ML IJ SOLN
INTRAMUSCULAR | Status: AC
  Filled 2018-11-07: qty 2

## 2018-11-07 MED ORDER — LACTATED RINGERS IV SOLN
INTRAVENOUS | Status: DC
  Administered 2018-11-07: 08:00:00 via INTRAVENOUS

## 2018-11-07 MED ORDER — FENTANYL CITRATE (PF) 250 MCG/5ML IJ SOLN
INTRAMUSCULAR | Status: AC
  Filled 2018-11-07: qty 5

## 2018-11-07 MED ORDER — GABAPENTIN 100 MG PO CAPS
ORAL_CAPSULE | ORAL | Status: AC
  Filled 2018-11-07: qty 2

## 2018-11-07 MED ORDER — DEXAMETHASONE SODIUM PHOSPHATE 10 MG/ML IJ SOLN
INTRAMUSCULAR | Status: AC
  Filled 2018-11-07: qty 1

## 2018-11-07 MED ORDER — KETOROLAC TROMETHAMINE 15 MG/ML IJ SOLN
INTRAMUSCULAR | Status: AC
  Administered 2018-11-07: 08:00:00 15 mg via INTRAVENOUS
  Filled 2018-11-07: qty 1

## 2018-11-07 MED ORDER — BUPIVACAINE HCL (PF) 0.5 % IJ SOLN
INTRAMUSCULAR | Status: AC
  Filled 2018-11-07: qty 30

## 2018-11-07 MED ORDER — GABAPENTIN 300 MG PO CAPS
ORAL_CAPSULE | ORAL | Status: AC
  Administered 2018-11-07: 08:00:00 300 mg via ORAL
  Filled 2018-11-07: qty 1

## 2018-11-07 MED ORDER — ONDANSETRON HCL 4 MG/2ML IJ SOLN
INTRAMUSCULAR | Status: DC | PRN
  Administered 2018-11-07: 4 mg via INTRAVENOUS

## 2018-11-07 MED ORDER — LIDOCAINE 2% (20 MG/ML) 5 ML SYRINGE
INTRAMUSCULAR | Status: AC
  Filled 2018-11-07: qty 5

## 2018-11-07 SURGICAL SUPPLY — 14 items
ABLATOR SURESOUND NOVASURE (ABLATOR) ×3 IMPLANT
CATH ROBINSON RED A/P 16FR (CATHETERS) ×3 IMPLANT
GLOVE BIO SURGEON STRL SZ7 (GLOVE) ×3 IMPLANT
GLOVE BIOGEL PI IND STRL 7.0 (GLOVE) ×2 IMPLANT
GLOVE BIOGEL PI INDICATOR 7.0 (GLOVE) ×4
GOWN STRL REUS W/ TWL LRG LVL3 (GOWN DISPOSABLE) ×1 IMPLANT
GOWN STRL REUS W/TWL LRG LVL3 (GOWN DISPOSABLE) ×2
GOWN STRL REUS W/TWL XL LVL3 (GOWN DISPOSABLE) ×3 IMPLANT
HIBICLENS CHG 4% 4OZ BTL (MISCELLANEOUS) ×3 IMPLANT
KIT PROCEDURE FLUENT (KITS) ×3 IMPLANT
KIT TURNOVER KIT B (KITS) ×3 IMPLANT
PACK VAGINAL MINOR WOMEN LF (CUSTOM PROCEDURE TRAY) ×3 IMPLANT
PAD OB MATERNITY 4.3X12.25 (PERSONAL CARE ITEMS) ×3 IMPLANT
TOWEL GREEN STERILE FF (TOWEL DISPOSABLE) ×6 IMPLANT

## 2018-11-07 NOTE — Anesthesia Preprocedure Evaluation (Signed)
Anesthesia Evaluation  Patient identified by MRN, date of birth, ID band Patient awake    Reviewed: Allergy & Precautions, NPO status , Patient's Chart, lab work & pertinent test results  Airway Mallampati: I       Dental no notable dental hx. (+) Teeth Intact   Pulmonary neg pulmonary ROS,    Pulmonary exam normal breath sounds clear to auscultation       Cardiovascular negative cardio ROS Normal cardiovascular exam Rhythm:Regular Rate:Normal     Neuro/Psych negative neurological ROS  negative psych ROS   GI/Hepatic negative GI ROS, Neg liver ROS,   Endo/Other  negative endocrine ROS  Renal/GU negative Renal ROS  negative genitourinary   Musculoskeletal   Abdominal (+) + obese,   Peds  Hematology   Anesthesia Other Findings   Reproductive/Obstetrics negative OB ROS                             Anesthesia Physical Anesthesia Plan  ASA: II  Anesthesia Plan: General   Post-op Pain Management:    Induction: Intravenous  PONV Risk Score and Plan: 4 or greater and Ondansetron, Dexamethasone, Midazolam and Scopolamine patch - Pre-op  Airway Management Planned: LMA  Additional Equipment:   Intra-op Plan:   Post-operative Plan: Extubation in OR  Informed Consent: I have reviewed the patients History and Physical, chart, labs and discussed the procedure including the risks, benefits and alternatives for the proposed anesthesia with the patient or authorized representative who has indicated his/her understanding and acceptance.     Dental advisory given  Plan Discussed with: CRNA  Anesthesia Plan Comments:         Anesthesia Quick Evaluation

## 2018-11-07 NOTE — Anesthesia Postprocedure Evaluation (Signed)
Anesthesia Post Note  Patient: Carmen Porter  Procedure(s) Performed: DILATATION & CURETTAGE/HYSTEROSCOPY WITH NOVASURE ABLATION (N/A Vagina )     Patient location during evaluation: Phase II Anesthesia Type: General Level of consciousness: awake Pain management: pain level controlled Vital Signs Assessment: post-procedure vital signs reviewed and stable Respiratory status: spontaneous breathing Cardiovascular status: stable Postop Assessment: no apparent nausea or vomiting Anesthetic complications: no    Last Vitals:  Vitals:   11/07/18 1045 11/07/18 1112  BP: (!) 176/102 (!) 173/98  Pulse: (!) 58 (!) 55  Resp: 14 14  Temp:  (!) 36.2 C  SpO2: 100% 100%    Last Pain:  Vitals:   11/07/18 1045  TempSrc:   PainSc: 0-No pain   Pain Goal: Patients Stated Pain Goal: 2 (11/07/18 0800)                 Huston Foley

## 2018-11-07 NOTE — Interval H&P Note (Signed)
History and Physical Interval Note:  11/07/2018 9:24 AM  Carmen Porter  has presented today for surgery, with the diagnosis of AUB.  The various methods of treatment have been discussed with the patient and family. After consideration of risks, benefits and other options for treatment, the patient has consented to  Procedure(s): [place] (N/A) as a surgical intervention.  The patient's history has been reviewed, patient examined, no change in status, stable for surgery.  I have reviewed the patient's chart and labs.  Questions were answered to the patient's satisfaction.     Lavonia Drafts

## 2018-11-07 NOTE — Anesthesia Procedure Notes (Signed)
Procedure Name: LMA Insertion Date/Time: 11/07/2018 9:37 AM Performed by: Alain Marion, CRNA Pre-anesthesia Checklist: Patient identified, Emergency Drugs available, Suction available and Patient being monitored Patient Re-evaluated:Patient Re-evaluated prior to induction Oxygen Delivery Method: Circle System Utilized Preoxygenation: Pre-oxygenation with 100% oxygen Induction Type: IV induction Ventilation: Mask ventilation without difficulty LMA: LMA inserted LMA Size: 4.0 Number of attempts: 1 Airway Equipment and Method: Bite block Placement Confirmation: positive ETCO2 Tube secured with: Tape Dental Injury: Teeth and Oropharynx as per pre-operative assessment

## 2018-11-07 NOTE — Discharge Instructions (Signed)
Endometrial Ablation, Care After  This sheet gives you information about how to care for yourself after your procedure. Your health care provider may also give you more specific instructions. If you have problems or questions, contact your health care provider.  What can I expect after the procedure?  After the procedure, it is common to have:   A need to urinate more frequently than usual for the first 24 hours.   Cramps similar to menstrual cramps. These may last for 1-2 days.   Thin, watery vaginal discharge that is light pink or brown in color. This may last a few weeks. Discharge will be heavy for the first few days after your procedure. You may need to wear a sanitary pad.   Nausea.   Vaginal bleeding for 4-6 weeks after the procedure, as tissue healing occurs.  Follow these instructions at home:  Activity   Do not drive for 24 hours if you were given amedicine to help you relax (sedative) during your procedure.   Do not have sex or put anything into your vagina until your health care provider approves.   Do not lift anything that is heavier than 10 lb (4.5 kg), or the limit that you are told, until your health care provider says that it is safe.   Return to your normal activities as told by your health care provider. Ask your health care provider what activities are safe for you.  General instructions     Take over-the-counter and prescription medicines only as told by your health care provider.   Do not take baths, swim, or use a hot tub until your health care provider approves. You will be able to take showers.   Check your vaginal area every day for signs of infection. Check for:  ? Redness, swelling, or pain.  ? More discharge or blood, instead of less.  ? Bad-smelling discharge.   Keep all follow-up visits as told by your health care provider. This is important.   Drink enough fluid to keep your urine pale yellow.  Contact a health care provider if you have:   Vaginal redness, swelling, or  pain.   Vaginal discharge or bleeding that gets worse instead of getting better.   Bad-smelling vaginal discharge.   A fever or chills.   Trouble urinating.  Get help right away if you have:   Heavy vaginal bleeding.   Severe cramps.  Summary   After endometrial ablation, it is normal to have thin, watery vaginal discharge that is light pink or brown in color. This may last a few weeks and may be heavier right after the procedure.   Vaginal bleeding is also normal after the procedure and should get better with time.   Check your vaginal area every day for signs of infection, such as bad-smelling discharge.   Keep all follow-up visits as told by your health care provider. This is important.  This information is not intended to replace advice given to you by your health care provider. Make sure you discuss any questions you have with your health care provider.  Document Released: 03/29/2017 Document Revised: 03/29/2017 Document Reviewed: 03/29/2017  Elsevier Interactive Patient Education  2019 [employer]

## 2018-11-07 NOTE — Transfer of Care (Signed)
Immediate Anesthesia Transfer of Care Note  Patient: Carmen Porter  Procedure(s) Performed: DILATATION & CURETTAGE/HYSTEROSCOPY WITH NOVASURE ABLATION (N/A Vagina )  Patient Location: PACU  Anesthesia Type:General  Level of Consciousness: awake, alert  and oriented  Airway & Oxygen Therapy: Patient Spontanous Breathing and Patient connected to face mask oxygen  Post-op Assessment: Report given to RN and Post -op Vital signs reviewed and stable  Post vital signs: Reviewed and stable  Last Vitals:  Vitals Value Taken Time  BP 160/107 11/07/2018 10:15 AM  Temp    Pulse 84 11/07/2018 10:15 AM  Resp 18 11/07/2018 10:15 AM  SpO2 100 % 11/07/2018 10:15 AM  Vitals shown include unvalidated device data.  Last Pain:  Vitals:   11/07/18 0800  TempSrc: Oral  PainSc: 0-No pain      Patients Stated Pain Goal: 2 (54/56/25 6389)  Complications: No apparent anesthesia complications

## 2018-11-07 NOTE — Brief Op Note (Signed)
11/07/2018  10:09 AM  PATIENT:  Carmen Porter  53 y.o. female  PRE-OPERATIVE DIAGNOSIS:  AUB  POST-OPERATIVE DIAGNOSIS:  Abnormal uterine bleeding  PROCEDURE:  Procedure(s): DILATATION & CURETTAGE/HYSTEROSCOPY WITH NOVASURE ABLATION (N/A)  SURGEON:  Surgeon(s) and Role:    * Lavonia Drafts, MD - Primary  ANESTHESIA:   general and paracervical block  EBL:  5 mL   BLOOD ADMINISTERED:none  DRAINS: none   LOCAL MEDICATIONS USED:  MARCAINE     SPECIMEN:  Source of Specimen:  endometrial currettings  DISPOSITION OF SPECIMEN:  pathology  COUNTS:  YES  TOURNIQUET:  * No tourniquets in log *  DICTATION: .Note written in EPIC  PLAN OF CARE: Discharge to home after PACU  PATIENT DISPOSITION:  PACU - hemodynamically stable.   Delay start of Pharmacological VTE agent (>24hrs) due to surgical blood loss or risk of bleeding: not applicable  Complications: none immediate  Elysa Womac L. Harraway-Smith, M.D., Cherlynn June

## 2018-11-07 NOTE — Op Note (Signed)
11/07/2018  10:09 AM  PATIENT:  Carmen Porter  53 y.o. female  PRE-OPERATIVE DIAGNOSIS:  AUB  POST-OPERATIVE DIAGNOSIS:  Abnormal uterine bleeding  PROCEDURE:  Procedure(s): DILATATION & CURETTAGE/HYSTEROSCOPY WITH NOVASURE ABLATION (N/A)  SURGEON:  Surgeon(s) and Role:    * Lavonia Drafts, MD - Primary  ANESTHESIA:   general and paracervical block  EBL:  5 mL   BLOOD ADMINISTERED:none  DRAINS: none   LOCAL MEDICATIONS USED:  MARCAINE     SPECIMEN:  Source of Specimen:  endometrial currettings  DISPOSITION OF SPECIMEN:  pathology  COUNTS:  YES  TOURNIQUET:  * No tourniquets in log *  DICTATION: .Note written in EPIC  PLAN OF CARE: Discharge to home after PACU  PATIENT DISPOSITION:  PACU - hemodynamically stable.   Delay start of Pharmacological VTE agent (>24hrs) due to surgical blood loss or risk of bleeding: not applicable  Complications: none immediate  Findings: diffusely proliferative endometrium;  A bimanual exam revealed a normal size and shape anteverted mobile uterus. Her adnexa were non-enlarged.    The risks, benefits, and alternatives of surgery were explained, understood, and accepted. The consents were signed and all questions were answered. She was taken to the operating room and general anesthesia was applied without complication. She was placed in the dorsal lithotomy position and her vagina and abdomen were prepped and draped in the usual sterile fashion.  A bivalved speculum was placed in the patients' vagina and the anterior lip of the cervix was grasped with a single toothed tenaculum. A paracervical block was performed at 5 and 7 o'clock with 20cc of 0.5% Marcaine.   The endometrial cavity was sounded to 10.5cm and the endocervical length measured 3cm. A hysteroscope was inserted and the endometrium was noted to be thickened with several polyps.  The ostia on both sides were noted.  The scope was removed and a sharp currete was used to  scape the lining of the uterus until a gritty texture was noted throughout.  Specimens were sent to pathology.  The NovaSure device was then inserted and seated using 6.5cm as the cavity length and 4.7cm as the cavity width.  The total activation time was 52 sec at a power of 168w.  The hysteroscope was reinserted and an even burn pattern was noted with sparing of the fundus.  The single toothed tenaculum was removed at the end of the case and no bleeding was noted from the cervix.   The patient was extubated and taken to the recovery room in stable condition.  Sponge, lap and instrument counts were correct.  There were no complications.  Prachi Oftedahl L. Harraway-Smith, M.D., Cherlynn June

## 2018-11-08 ENCOUNTER — Encounter (HOSPITAL_COMMUNITY): Payer: Self-pay | Admitting: Obstetrics & Gynecology

## 2018-11-15 ENCOUNTER — Telehealth: Payer: Self-pay

## 2018-11-15 ENCOUNTER — Ambulatory Visit: Payer: 59 | Admitting: Obstetrics & Gynecology

## 2018-11-15 NOTE — Telephone Encounter (Signed)
Left message to start virtual visit.

## 2018-11-16 ENCOUNTER — Ambulatory Visit: Payer: 59 | Admitting: Obstetrics & Gynecology

## 2018-11-16 ENCOUNTER — Telehealth: Payer: Self-pay

## 2018-11-16 NOTE — Telephone Encounter (Signed)
Patient called to begin visit. Left messageto return call to office.  Kathrene Alu RN

## 2018-11-27 ENCOUNTER — Ambulatory Visit: Payer: 59 | Admitting: Obstetrics & Gynecology

## 2019-11-08 ENCOUNTER — Encounter: Payer: 59 | Admitting: Nurse Practitioner

## 2019-12-02 IMAGING — DX DG KNEE COMPLETE 4+V*L*
4 series · 4 of 4 positions shown · non-contrast
Comparison: None.

CLINICAL DATA: Chronic knee pain and swelling for several months

EXAM:
LEFT KNEE - COMPLETE 4+ VIEW

[knee ap]
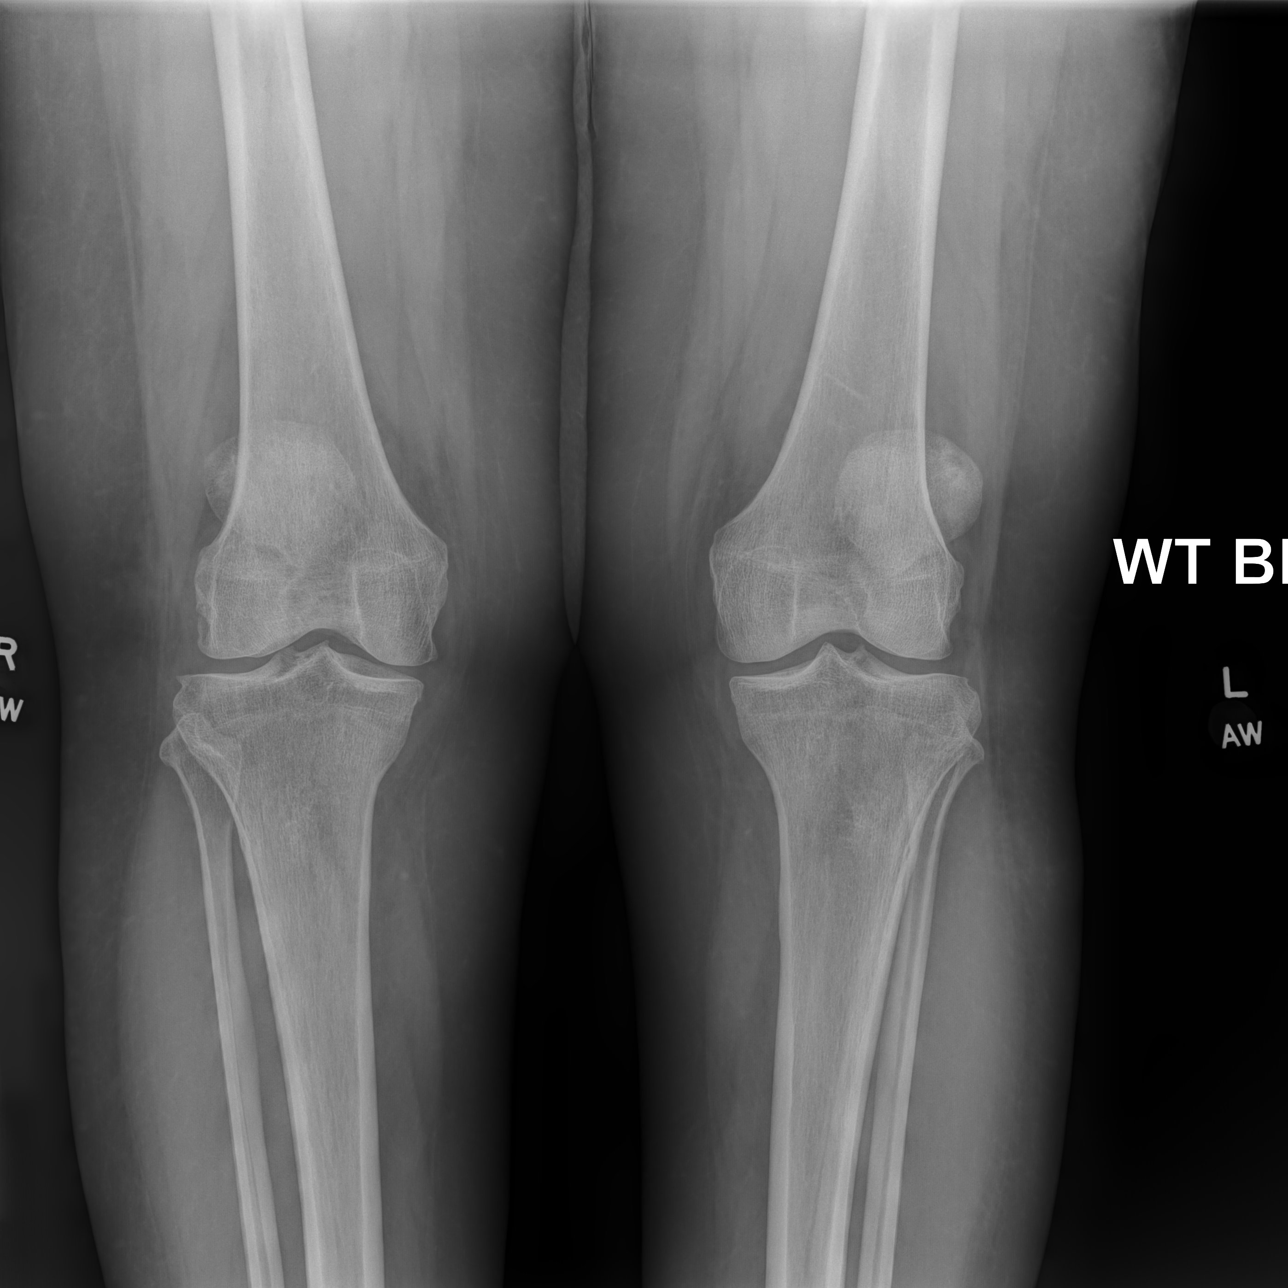

[knee [person_name] view pa]
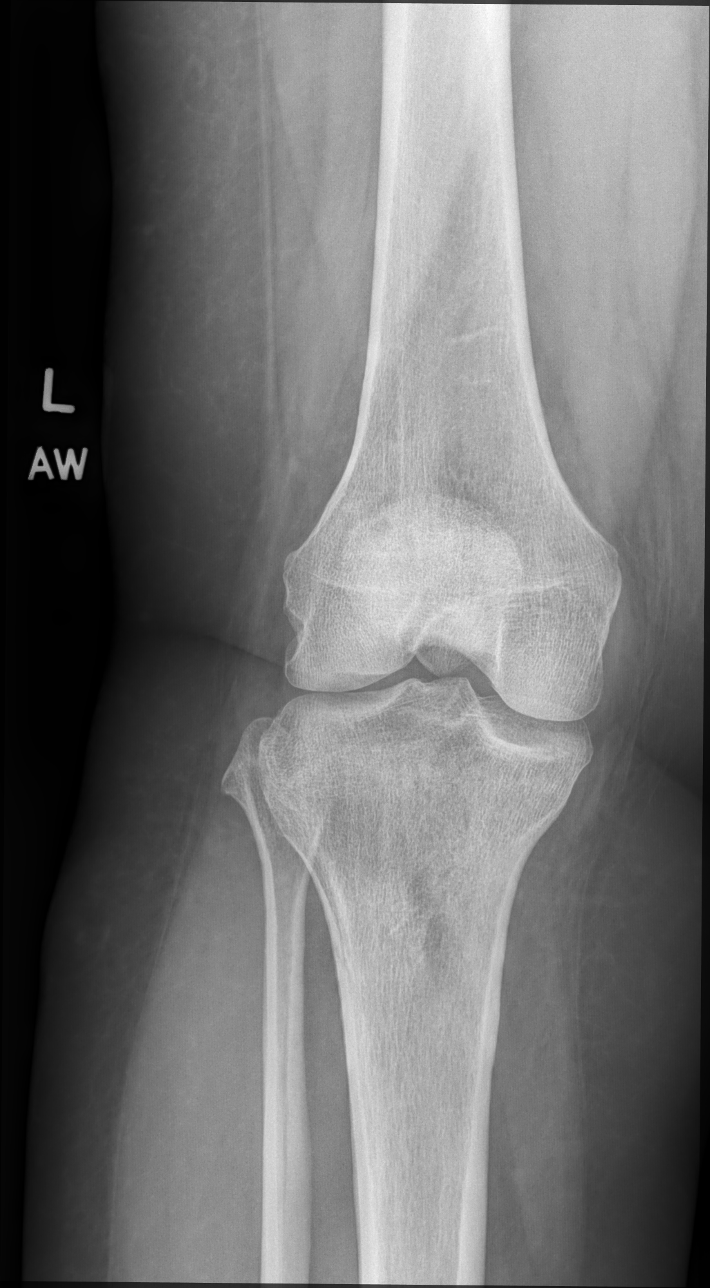

[knee lat]
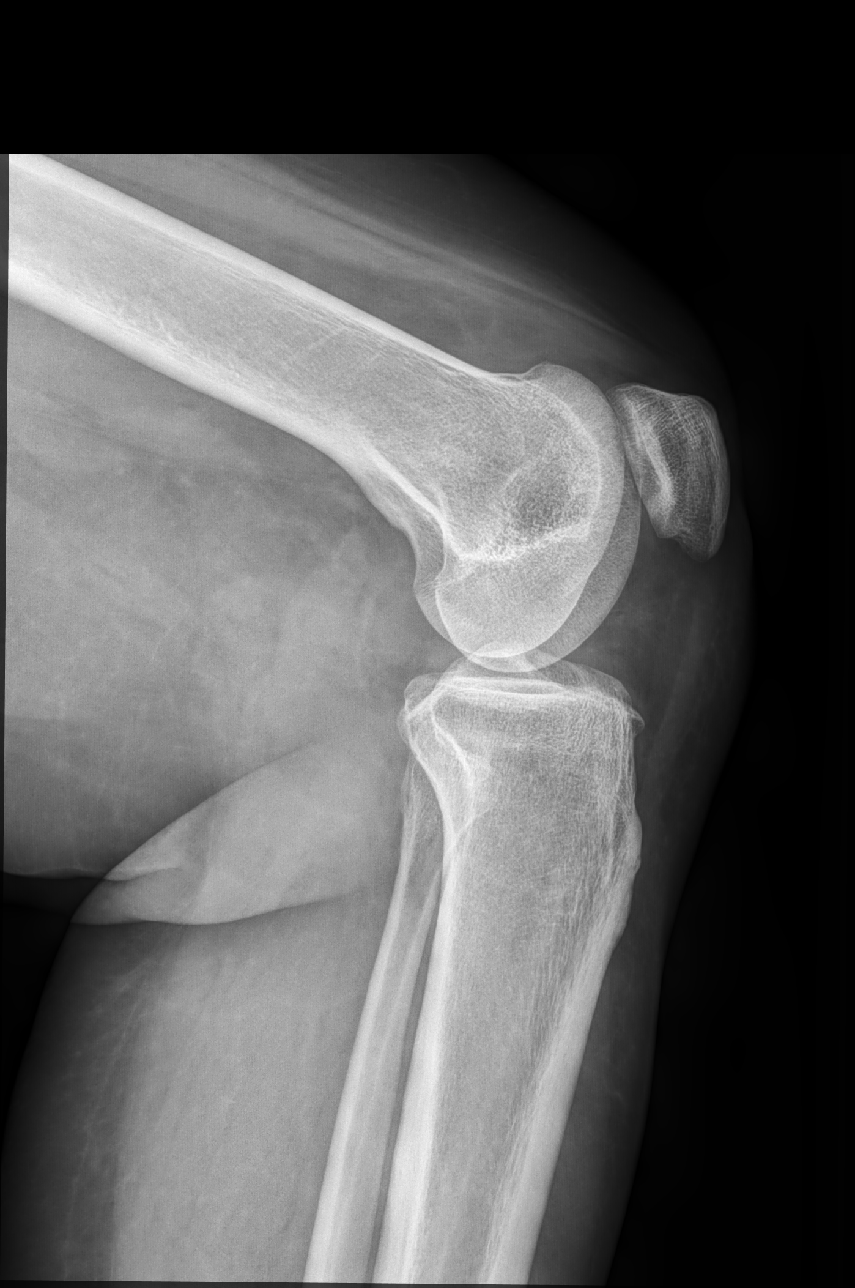

[patella (sunrise)]
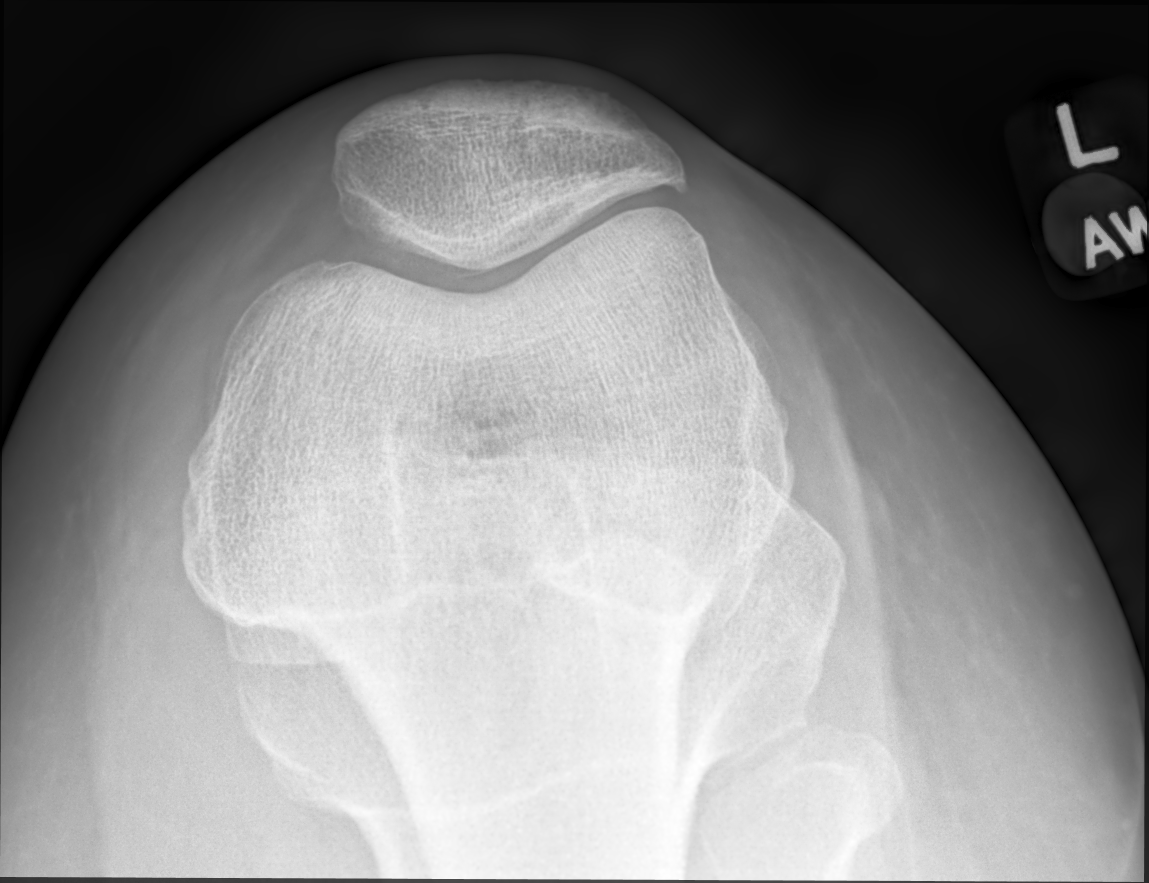

[4 of 4 positions shown; findings below may reference images not displayed]

FINDINGS: No acute fracture or dislocation is noted. Mild patellofemoral
degenerative changes are noted.
IMPRESSION: No acute abnormality noted.

## 2019-12-02 IMAGING — DX DG KNEE COMPLETE 4+V*R*
4 series · 4 of 4 positions shown · non-contrast
Comparison: None.

CLINICAL DATA: Knee pain

EXAM:
RIGHT KNEE - COMPLETE 4+ VIEW

[knee ap]
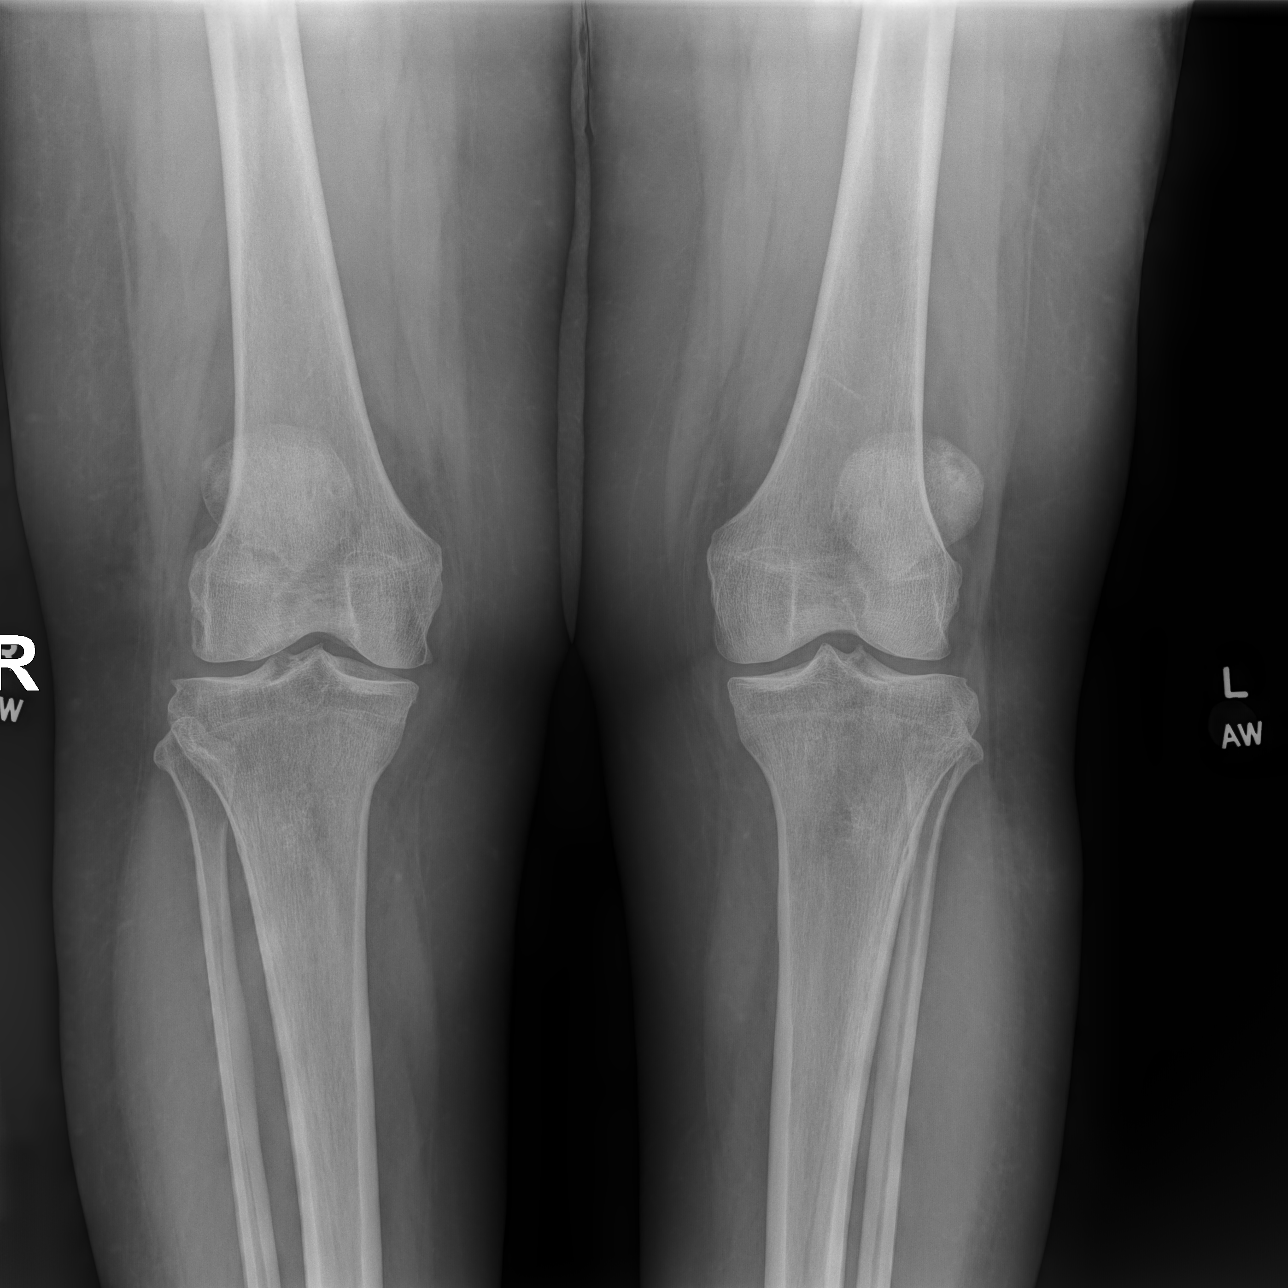

[knee [person_name] view pa]
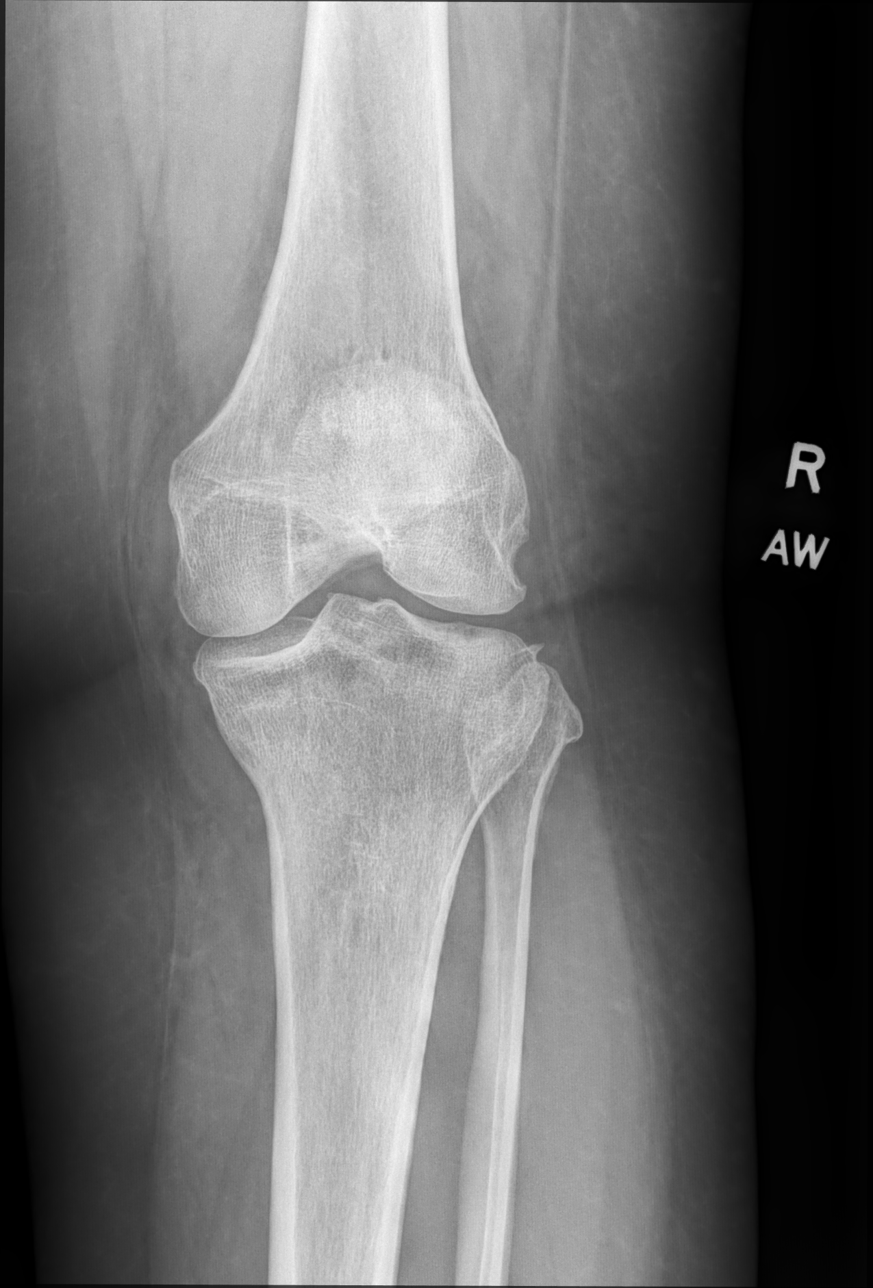

[knee lat]
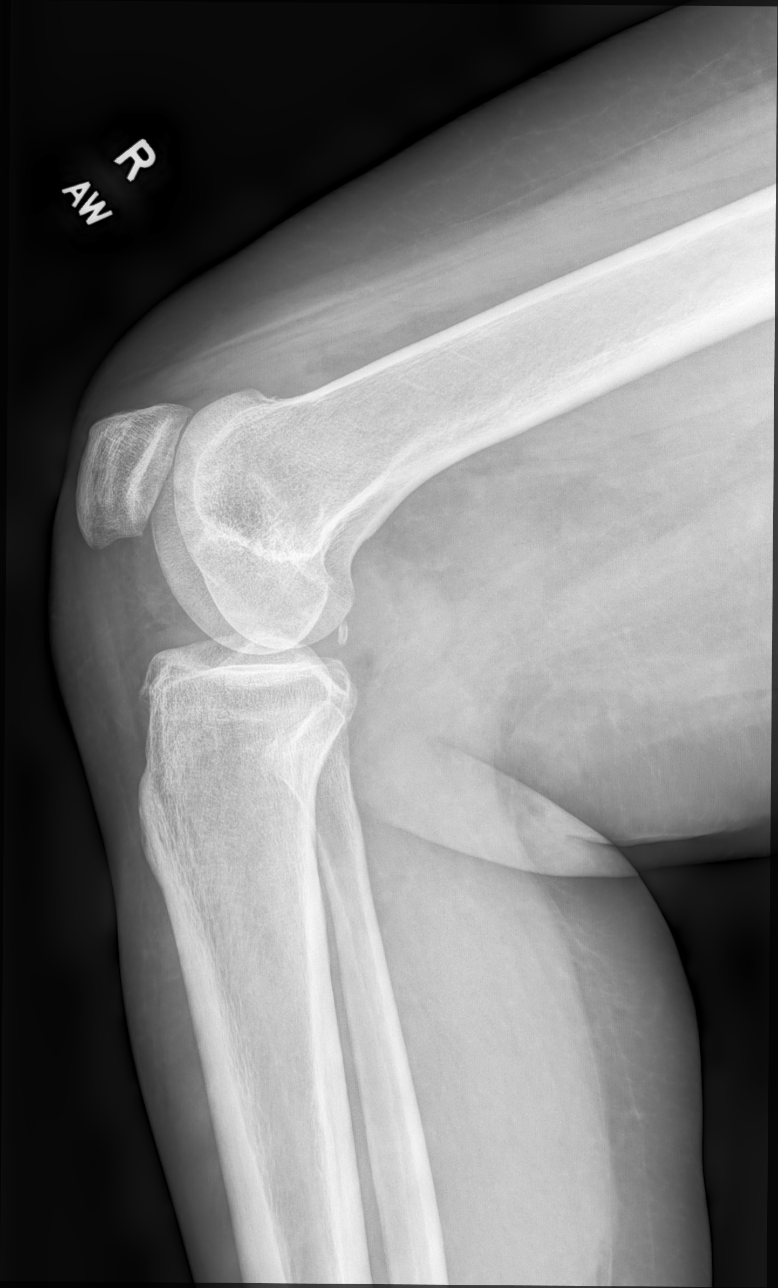

[patella (sunrise)]
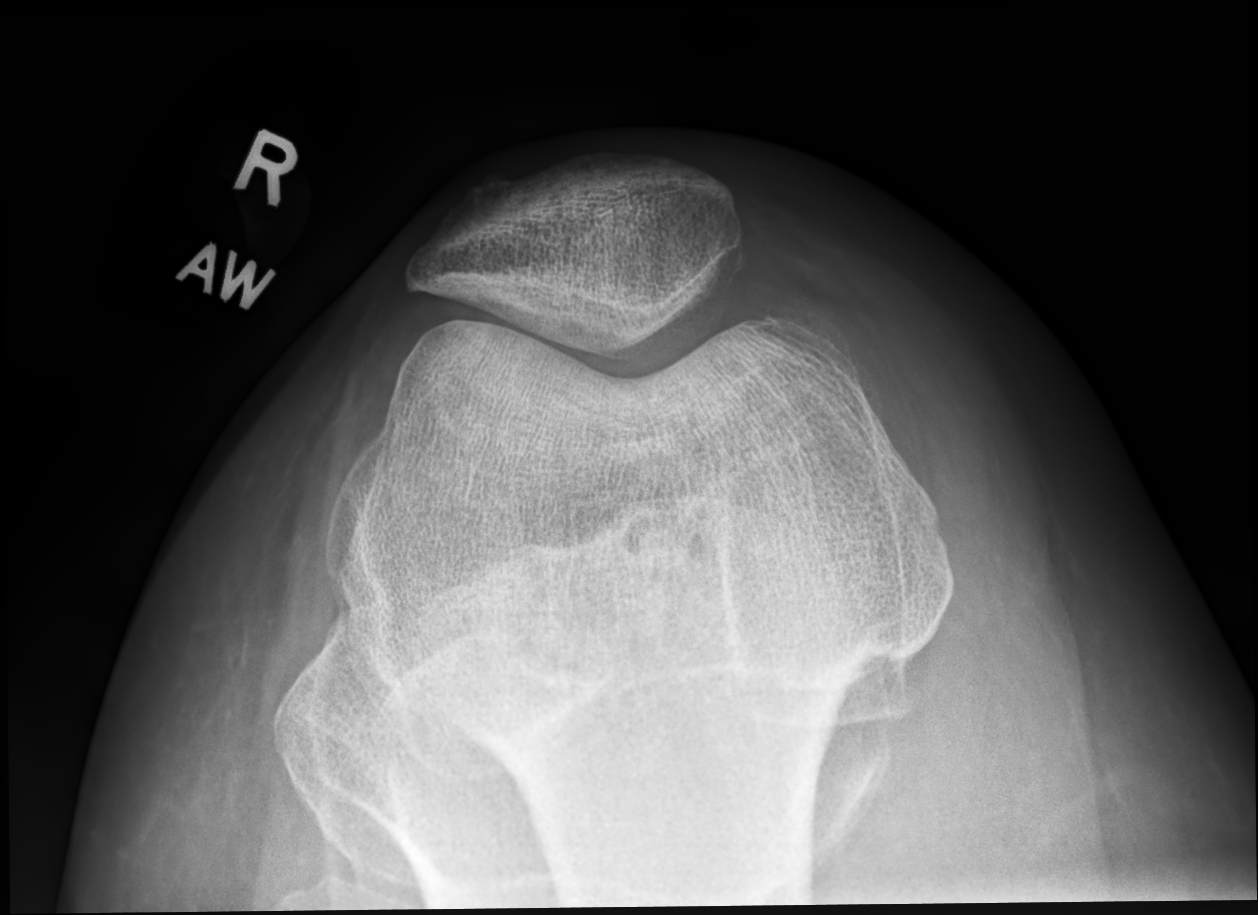

[4 of 4 positions shown; findings below may reference images not displayed]

FINDINGS: No acute fracture or dislocation is noted. Mild degenerative changes
are noted most marked in the lateral joint space. No joint effusion
is seen. No other focal abnormality is noted.
IMPRESSION: Degenerative change without acute abnormality.

## 2019-12-17 ENCOUNTER — Telehealth: Payer: Self-pay | Admitting: Nurse Practitioner

## 2019-12-17 NOTE — Telephone Encounter (Signed)
Called patient to reschedule missed appt from 11/08/19. Left voicemail for her to call office when ready to schedule.

## 2020-10-27 ENCOUNTER — Other Ambulatory Visit: Payer: Self-pay

## 2020-10-27 ENCOUNTER — Emergency Department (HOSPITAL_BASED_OUTPATIENT_CLINIC_OR_DEPARTMENT_OTHER)
Admission: EM | Admit: 2020-10-27 | Discharge: 2020-10-28 | Disposition: A | Discharge status: Home / Self Care | Payer: 59 | Attending: Emergency Medicine | Admitting: Emergency Medicine

## 2020-10-27 ENCOUNTER — Encounter (HOSPITAL_BASED_OUTPATIENT_CLINIC_OR_DEPARTMENT_OTHER): Payer: Self-pay | Admitting: *Deleted

## 2020-10-27 DIAGNOSIS — M546 Pain in thoracic spine: Secondary | ICD-10-CM | POA: Insufficient documentation

## 2020-10-27 DIAGNOSIS — Y92410 Unspecified street and highway as the place of occurrence of the external cause: Secondary | ICD-10-CM | POA: Diagnosis not present
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
## 2020-10-28 NOTE — Discharge Instructions (Signed)
You will hurt worse tomorrow this is normal.  Please follow-up with your family doctor.  If you have persistent symptoms over a week they may elect to perform imaging.  You can take Tylenol and ibuprofen for discomfort.
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
## 2020-10-29 ENCOUNTER — Other Ambulatory Visit: Payer: Self-pay

## 2020-10-30 ENCOUNTER — Encounter: Payer: Self-pay | Admitting: Family

## 2020-10-30 ENCOUNTER — Other Ambulatory Visit (HOSPITAL_BASED_OUTPATIENT_CLINIC_OR_DEPARTMENT_OTHER): Payer: Self-pay

## 2020-10-30 ENCOUNTER — Ambulatory Visit: Payer: 59 | Admitting: Nurse Practitioner

## 2020-10-30 ENCOUNTER — Encounter: Payer: Self-pay | Admitting: Nurse Practitioner

## 2020-10-30 DIAGNOSIS — S161XXS Strain of muscle, fascia and tendon at neck level, sequela: Secondary | ICD-10-CM | POA: Diagnosis not present

## 2020-10-30 DIAGNOSIS — M62838 Other muscle spasm: Secondary | ICD-10-CM | POA: Diagnosis not present

## 2020-10-30 MED ORDER — METHOCARBAMOL 500 MG PO TABS
500.0000 mg | ORAL_TABLET | Freq: Three times a day (TID) | ORAL | 0 refills | Status: DC | PRN
  Filled 2020-10-30: qty 21, 7d supply, fill #0

## 2020-10-30 MED ORDER — IBUPROFEN 600 MG PO TABS
600.0000 mg | ORAL_TABLET | Freq: Three times a day (TID) | ORAL | 0 refills | Status: DC
  Filled 2020-10-30: qty 30, 10d supply, fill #0

## 2020-10-30 NOTE — Patient Instructions (Signed)
Alternate between warm and cold compress as needed Do not take robaxin and drive  Cervical Sprain A cervical sprain is also called a neck sprain. It is a stretch or tear in one or more ligaments in the neck. Ligaments are tissues that connect bones to each other. Neck sprains can be mild, bad, or very bad. A very bad sprain in the neck can cause the bones in the neck to be unstable. This can damage the spinal cord. It can also cause serious problems in the brain, spinal cord, and nerves (nervous system). Most neck sprains heal in 4-6 weeks. It can take more or less time depending on:  What caused the injury.  The amount of injury. What are the causes? Neck sprains may be caused by trauma, such as:  An injury from an accident in a vehicle such as a car or boat.  A fall.  The head and neck being moved front to back or side to side all of a sudden (whiplash injury). Mild neck sprains may be caused by wear and tear over time. What increases the risk? The following factors may make you more likely to develop this condition:  Taking part in activities that put you at high risk of hurting your neck. These include: ? Contact sports. ? Animator. ? Gymnastics. ? Diving.  Taking risks when driving or riding in a vehicle such as a car or boat.  Arthritis caused by wear and tear of the joints in the spine.  The neck not being very strong or flexible.  Having had a neck injury in the past.  Poor posture.  Spending a lot of time in certain positions that put stress on the neck. This may be from sitting at a computer for a long time. What are the signs or symptoms? Symptoms of this condition include:  Your neck, shoulders, or upper back feeling: ? Painful or sore. ? Stiff. ? Tender. ? Swollen. ? Hot, or like it is burning.  Sudden tightening of neck muscles (spasms).  Not being able to move the neck very much.  Headache.  Feeling dizzy.  Feeling like you may vomit, or  vomiting.  Having a hand or arm that: ? Feels weak. ? Loses feeling (feels numb). ? Tingles. You may get symptoms right away after injury, or you may get them over a few days. In some cases, symptoms may go away with treatment and come back over time. How is this treated? This condition is treated by:  Resting your neck.  Icing the part of your neck that is hurt.  Doing exercises to restore movement and strength to your neck (physical therapy). If there is no swelling, you may use heat therapy 2-3 days after the injury took place. If your injury is very bad, treatment may also include:  Keeping your neck in place for a length of time. This may be done using: ? A neck collar. This supports your chin and the back of your head. ? A cervical traction device. This is a sling that holds up your head. The sling removes weight and pressure from your neck. It may also help to relieve pain.  Medicines that help with: ? Pain. ? Irritation and swelling (inflammation).  Medicines that help to relax your muscles (muscle relaxants).  Surgery. This is rare. Follow these instructions at home: Medicines  Take over-the-counter and prescription medicines only as told by your doctor.  Ask your doctor if the medicine prescribed to you: ? Requires  you to avoid driving or using heavy machinery. ? Can cause trouble pooping (constipation). You may need to take these actions to prevent or treat trouble pooping:  Drink enough fluid to keep your pee (urine) pale yellow.  Take over-the-counter or prescription medicines.  Eat foods that are high in fiber. These include beans, whole grains, and fresh fruits and vegetables.  Limit foods that are high in fat and sugar. These include fried or sweet foods.   If you have a neck collar:  Wear it as told by your doctor. Do not take it off unless told.  Ask your doctor before adjusting your collar.  If you have long hair, keep it outside of the  collar.  Ask your doctor if you may take off the collar for cleaning and bathing. If you may take off the collar: ? Follow instructions about how to take it off safely. ? Clean it by hand with mild soap and water. Let it air-dry fully. ? If your collar has pads that you can take out:  Take the pads out every 1-2 days.  Wash them by hand with soap and water.  Let the pads air-dry fully before you put them back in the collar. ? Tell your doctor if your skin under the collar has irritation or sores. Managing pain, stiffness, and swelling  Use a cervical traction device, if told by your doctor.  If told, put ice on the affected area. To do this: ? Put ice in a plastic bag. ? Place a towel between your skin and the bag. ? Leave the ice on for 20 minutes, 2-3 times a day.  If told, put heat on the affected area. Do this before exercise or as often as told by your doctor. Use the heat source that your doctor recommends, such as a moist heat pack or a heating pad. ? Place a towel between your skin and the heat source. ? Leave the heat on for 20-30 minutes. ? Take the heat off if your skin turns bright red. This is very important if you cannot feel pain, heat, or cold. You may have a greater risk of getting burned.      Activity  Do not drive while wearing a neck collar. If you do not have a neck collar, ask if it is safe to drive while your neck heals.  Do not lift anything that is heavier than 10 lb (4.5 kg), or the limit that you are told, until your doctor tells you that it is safe.  Rest as told by your doctor.  Do exercises as told by your doctor or physical therapist.  Return to your normal activities as told by your doctor. Avoid positions and activities that make you feel worse. Ask your doctor what activities are safe for you. General instructions  Do not use any products that contain nicotine or tobacco, such as cigarettes, e-cigarettes, and chewing tobacco. These can delay  healing. If you need help quitting, ask your doctor.  Keep all follow-up visits as told by your doctor or physical therapist. This is important. How is this prevented? To prevent a neck sprain from happening again:  Practice good posture. Adjust your workstation to help you do this.  Exercise regularly as told by your doctor or physical therapist.  Avoid activities that are risky or may cause a neck sprain. Contact a doctor if:  Your symptoms get worse.  Your symptoms do not get better after 2 weeks of treatment.  Your  pain gets worse.  Medicine does not help your pain.  You have new symptoms that you cannot explain.  Your neck collar gives you sores on your skin or bothers your skin. Get help right away if:  You have very bad pain.  You get any of the following in any part of your body: ? Loss of feeling. ? Tingling. ? Weakness.  You cannot move a part of your body.  You have neck pain and either of these: ? Very bad dizziness. ? A very bad headache. Summary  A cervical sprain is also called a neck sprain. It is a stretch or tear in one or more ligaments in the neck. Ligaments are tissues that connect bones.  Neck sprains may be caused by trauma, such as an injury or a fall.  You may get symptoms right away after injury, or you may get them over a few days.  Neck sprains may be treated with rest, heat, ice, medicines, exercise, and surgery. This information is not intended to replace advice given to you by your health care provider. Make sure you discuss any questions you have with your health care provider. Document Revised: 01/24/2019 Document Reviewed: 01/24/2019 Elsevier Patient Education  [place].
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
## 2020-11-05 ENCOUNTER — Other Ambulatory Visit (HOSPITAL_COMMUNITY): Payer: Self-pay

## 2021-04-08 ENCOUNTER — Encounter: Payer: Self-pay | Admitting: Obstetrics & Gynecology

## 2021-04-08 ENCOUNTER — Ambulatory Visit: Payer: 59 | Admitting: Obstetrics & Gynecology

## 2021-04-08 ENCOUNTER — Other Ambulatory Visit: Payer: Self-pay

## 2021-04-08 ENCOUNTER — Other Ambulatory Visit (HOSPITAL_BASED_OUTPATIENT_CLINIC_OR_DEPARTMENT_OTHER): Payer: Self-pay

## 2021-04-08 VITALS — BP 143/89 | HR 72 | Ht 67.0 in | Wt 225.0 lb

## 2021-04-08 DIAGNOSIS — R102 Pelvic and perineal pain: Secondary | ICD-10-CM

## 2021-04-08 DIAGNOSIS — N8003 Adenomyosis of the uterus: Secondary | ICD-10-CM | POA: Diagnosis not present

## 2021-04-08 MED ORDER — NORETHINDRONE ACET-ETHINYL EST 1-20 MG-MCG PO TABS
1.0000 | ORAL_TABLET | Freq: Every day | ORAL | 11 refills | Status: DC
  Filled 2021-04-08: qty 28, 28d supply, fill #0
  Filled 2021-04-09: qty 84, 84d supply, fill #0

## 2021-04-08 NOTE — Progress Notes (Signed)
Patient has had abdominal pain off and on for approx four months.

## 2021-04-08 NOTE — Patient Instructions (Signed)
Vaginal Hysterectomy A vaginal hysterectomy is a procedure to remove all or part of the uterus through a small incision in the vagina. In this procedure, your health care provider may remove your entire uterus, including the cervix. The cervix is the opening and bottom part of the uterus and is located between the vagina and the uterus. Sometimes, the ovaries and fallopian tubes are also removed. This surgery may be done to treat problems such as: Noncancerous growths in the uterus (uterine fibroids) that cause symptoms. A condition that causes the lining of the uterus to grow in other areas (endometriosis). Problems with pelvic support. Cancer of the cervix, ovaries, uterus, or tissue that lines the uterus (endometrium). Excessive bleeding in the uterus. When removing your uterus, your health care provider may also remove the ovaries and the fallopian tubes. After this procedure, you will no longer be able to have a baby, and you will no longer have a menstrual period. Tell a health care provider about: Any allergies you have. All medicines you are taking, including vitamins, herbs, eye drops, creams, and over-the-counter medicines. Any problems you or family members have had with anesthetic medicines. Any blood disorders you have. Any surgeries you have had. Any medical conditions you have. Whether you are pregnant or may be pregnant. What are the risks? Generally, this is a safe procedure. However, problems may occur, including: Bleeding. Infection. Blood clots in the legs or lungs. Damage to nearby structures or organs. Pain during sex. Allergic reactions to medicines. What happens before the procedure? Staying hydrated Follow instructions from your health care provider about hydration, which may include: Up to 2 hours before the procedure - you may continue to drink clear liquids, such as water, clear fruit juice, black coffee, and plain tea.  Eating and drinking  restrictions Follow instructions from your health care provider about eating and drinking, which may include: 8 hours before the procedure - stop eating heavy meals or foods, such as meat, fried foods, or fatty foods. 6 hours before the procedure - stop eating light meals or foods, such as toast or cereal. 6 hours before the procedure - stop drinking milk or drinks that contain milk. 2 hours before the procedure - stop drinking clear liquids. Medicines Ask your health care provider about: Changing or stopping your regular medicines. This is especially important if you are taking diabetes medicines or blood thinners. Taking medicines such as aspirin and ibuprofen. These medicines can thin your blood. Do not take these medicines unless your health care provider tells you to take them. Taking over-the-counter medicines, vitamins, herbs, and supplements. You may be asked to take a medicine to empty your colon (bowel preparation). General instructions If you were asked to do a bowel preparation before the procedure, follow instructions from your health care provider. This procedure can affect the way you feel about yourself. Talk with your health care provider about the physical and emotional changes hysterectomy may cause. Do not use any products that contain nicotine or tobacco for at least 4 weeks before the procedure. These products include cigarettes, e-cigarettes, and chewing tobacco. If you need help quitting, ask your health care provider. Plan to have a responsible adult take you home from the hospital or clinic. Plan to have a responsible adult care for you for the time you are told after you leave the hospital or clinic. This is important. Surgery safety Ask your health care provider: How your surgery site will be marked. What steps will be taken to  help prevent infection. These may include: Removing hair at the surgery site. Washing skin with a germ-killing soap. Receiving antibiotic  medicine. What happens during the procedure? An IV will be inserted into one of your veins. You will be given one or more of the following: A medicine to help you relax (sedative). A medicine to numb the area (local anesthetic). A medicine to make you fall asleep (general anesthetic). A medicine that is injected into your spine to numb the area below and slightly above the injection site (spinal anesthetic). A medicine that is injected into an area of your body to numb everything below the injection site (regional anesthetic). Your surgeon will make an incision in your vagina. Your surgeon will locate and remove all or part of your uterus. Part or all of the uterus will be removed through the vagina. Your ovaries and fallopian tubes may be removed at the same time. The incision in your vagina will be closed with stitches (sutures) that dissolve over time. The procedure may vary among health care providers and hospitals. What happens after the procedure? Your blood pressure, heart rate, breathing rate, and blood oxygen level will be monitored until you leave the hospital or clinic. You will be encouraged to walk as soon as possible. You will also use a device or do breathing exercises to keep your lungs clear. You may have to wear compression stockings. These stockings help to prevent blood clots and reduce swelling in your legs. You will be given pain medicine as needed. You will need to wear a sanitary pad for vaginal discharge or bleeding. Summary A vaginal hysterectomy is a procedure to remove all or part of the uterus through the vagina. You may need a vaginal hysterectomy to treat a variety of abnormalities of the uterus. Plan to have a responsible adult take you home from the hospital or clinic. Plan to have a responsible adult care for you for the time you are told after you leave the hospital or clinic. This is important. This information is not intended to replace advice given to  you by your health care provider. Make sure you discuss any questions you have with your health care provider. Document Revised: 01/18/2020 Document Reviewed: 01/18/2020 Elsevier Patient Education  [place]. Vaginal Hysterectomy, Care After The following information offers guidance on how to care for yourself after your procedure. Your health care provider may also give you more specific instructions. If you have problems or questions, contact your health care provider. What can I expect after the procedure? After the procedure, it is common to have: Pain in the lower abdomen and vagina. Vaginal bleeding and discharge for up to 1 week. You will need to use a sanitary pad after this procedure. Difficulty having a bowel movement (constipation). Temporary problems emptying the bladder. Tiredness (fatigue). Poor appetite. Less interest in sex. Feelings of sadness or other emotions. If your ovaries were also removed, it is also common to have symptoms of menopause, such as hot flashes, night sweats, and lack of sleep (insomnia). Follow these instructions at home: Medicines  Take over-the-counter and prescription medicines only as told by your health care provider. Do not take aspirin or NSAIDs, such as ibuprofen. These medicines can cause bleeding. Ask your health care provider if the medicine prescribed to you: Requires you to avoid driving or using machinery. Can cause constipation. You may need to take these actions to prevent or treat constipation: Drink enough fluid to keep your urine pale yellow. Take  over-the-counter or prescription medicines. Eat foods that are high in fiber, such as beans, whole grains, and fresh fruits and vegetables. Limit foods that are high in fat and processed sugars, such as fried or sweet foods. Activity  Rest as told by your health care provider. Return to your normal activities as told by your health care provider. Ask your health care provider  what activities are safe for you Avoid sitting for a long time without moving. Get up to take short walks every 1-2 hours. This is important to improve blood flow and breathing. Ask for help if you feel weak or unsteady. Try to have someone home with you for 1-2 weeks to help you with everyday chores. Do not lift anything that is heavier than 10 lb (4.5 kg), or the limit that you are told, until your health care provider says that it is safe. If you were given a sedative during the procedure, it can affect you for several hours. Do not drive or operate machinery until your health care provider says that it is safe. Lifestyle Do not use any products that contain nicotine or tobacco. These products include cigarettes, chewing tobacco, and vaping devices, such as e-cigarettes. These can delay healing after surgery. If you need help quitting, ask your health care provider. Do not drink alcohol until your health care provider approves. General instructions Do not douche, use tampons, or have sex for at least 6 weeks, or as told by your health care provider. If you struggle with physical or emotional changes after your procedure, speak with your health care provider or a therapist. The stitches inside your vagina will dissolve over time and do not need to be taken out. Do not take baths, swim, or use a hot tub until your health care provider approves. You may only be allowed to take showers for 2-3 weeks Wear compression stockings as told by your health care provider. These stockings help to prevent blood clots and reduce swelling in your legs. Keep all follow-up visits. This is important. Contact a health care provider if: Your pain medicine is not helping. You have a fever. You have nausea or vomiting that does not go away. You feel dizzy. You have blood, pus, or a bad-smelling discharge from your vagina more than 1 week after the procedure. You continue to have trouble urinating 3-5 days after the  procedure. Get help right away if: You have severe pain in your abdomen or back. You faint. You have heavy vaginal bleeding and blood clots, soaking through a sanitary pad in less than 1 hour. You have chest pain or shortness of breath. You have pain, swelling, or redness in your leg. These symptoms may represent a serious problem that is an emergency. Do not wait to see if the symptoms will go away. Get medical help right away. Call your local emergency services (911 in the [place] Do not drive yourself to the hospital. Summary After the procedure, it is common to have pain, vaginal bleeding, constipation, temporary problems emptying your bladder, and feelings of sadness or other emotions. Take over-the-counter and prescription medicines only as told by your health care provider. Rest as told by your health care provider. Return to your normal activities as told by your health care provider. Contact a health care provider if your pain medicine is not helping, or you have a fever, dizziness, or trouble urinating several days after the procedure. Get help right away if you have severe pain in your abdomen or back,  or if you faint, have heavy bleeding, or have chest pain or shortness of breath. This information is not intended to replace advice given to you by your health care provider. Make sure you discuss any questions you have with your health care provider. Document Revised: 01/18/2020 Document Reviewed: 01/18/2020 Elsevier Patient Education  [place]. Pelvic Pain, Female Pelvic pain is pain in your lower abdomen, below your belly button and between your hips. The pain may start suddenly (be acute), keep coming back (be recurring), or last a long time (become chronic). Pelvic pain that lasts longer than 6 months is considered chronic. Pelvic pain may affect your: Reproductive organs. Urinary system. Digestive tract. Musculoskeletal system. There are many potential causes of pelvic  pain. Sometimes, the pain can be a result of digestive or urinary conditions, strained muscles or ligaments, or reproductive conditions. Sometimes the cause of pelvic pain is not known. Follow these instructions at home:  Take over-the-counter and prescription medicines only as told by your health care provider. Rest as told by your health care provider. Do not have sex if it hurts. Keep a journal of your pelvic pain. Write down: When the pain started. Where the pain is located. What seems to make the pain better or worse, such as food or your monthly period (menstrual cycle). Any symptoms you have along with the pain. Keep all follow-up visits. This is important. Contact a health care provider if: Medicine does not help your pain, or your pain comes back. You have new symptoms. You have abnormal vaginal discharge or bleeding, including bleeding after menopause. You have a fever or chills. You are constipated. You have blood in your urine or stool (feces). You have foul-smelling urine. You feel weak or light-headed. Get help right away if: You have sudden severe pain. Your pain gets steadily worse. You have severe pain along with fever, nausea, vomiting, or excessive sweating. You lose consciousness. These symptoms may represent a serious problem that is an emergency. Do not wait to see if the symptoms will go away. Get medical help right away. Call your local emergency services (911 in the [place] Do not drive yourself to the hospital. Summary Pelvic pain is pain in your lower abdomen, below your belly button and between your hips. There are many potential causes of pelvic pain. Keep a journal of your pelvic pain. This information is not intended to replace advice given to you by your health care provider. Make sure you discuss any questions you have with your health care provider. Document Revised: 09/23/2020 Document Reviewed: 09/23/2020 Elsevier Patient Education  [place].

## 2021-04-08 NOTE — Progress Notes (Signed)
History:  55 y.o. [id number] here today for eval of pelvic pain. Pt is s/p endometrial ablation. Several months after she began to have pain at the time of her cycle that has gotten intermittently worse. No AUB noted.   The following portions of the patient's history were reviewed and updated as appropriate: allergies, current medications, past family history, past medical history, past social history, past surgical history and problem list.  Review of Systems:  Pertinent items are noted in HPI.    Objective:  Physical Exam Blood pressure (!) 143/89, pulse 72, height 5\' 7"  (1.702 m), weight 225 lb (102.1 kg).  CONSTITUTIONAL: Well-developed, well-nourished female in no acute distress.  HENT:  Normocephalic, atraumatic EYES: Conjunctivae and EOM are normal. No scleral icterus.  NECK: Normal range of motion SKIN: Skin is warm and dry. No rash noted. Not diaphoretic.No pallor. [place]: Alert and oriented to person, place, and time. Normal coordination.  Abd: Soft, nontender and nondistended Pelvic: Normal appearing external genitalia; normal appearing vaginal mucosa and cervix.  Normal discharge.  Small uterus, mobile with good descensus. No other palpable masses. There is uterine tenderness  Labs and Imaging 04/05/2018 CLINICAL DATA:  Iron deficiency anemia due to chronic blood loss, fibroids, menorrhagia with irregular cycle   EXAM: TRANSABDOMINAL AND TRANSVAGINAL ULTRASOUND OF PELVIS   TECHNIQUE: Both transabdominal and transvaginal ultrasound examinations of the pelvis were performed. Transabdominal technique was performed for global imaging of the pelvis including uterus, ovaries, adnexal regions, and pelvic cul-de-sac. It was necessary to proceed with endovaginal exam following the transabdominal exam to visualize the endometrium.   COMPARISON:  None   FINDINGS: Uterus   Measurements: 10.8 x 7.2 x 9.7 cm = volume: 395 mL. Asymmetric thickening of the anterior wall of the  mid to upper uterus especially to the RIGHT, with slight posterior displacement of the endometrial stripe suggesting mass effect, question isoechoic leiomyoma approximately 4.1 x 4.6 x 4.9 cm versus other etiology potentially focal adenomyosis. Questionable small posterior heterogeneous and intramural nodule 11 x 8 x 14 mm, could represent a tiny leiomyoma   Endometrium   Thickness: 10 mm thick.  No endometrial fluid or focal abnormality   Right ovary   Measurements: 3.0 x 1.9 x 2.7 cm = volume: 8 mL. Normal morphology without mass   Left ovary   Measurements: 3.6 x 2.4 x 3.5 cm = volume: 16 mL. Dominant follicle 2.8 cm diameter. No additional mass.   Other findings   No free pelvic fluid or additional adnexal masses   IMPRESSION: Unremarkable ovaries   Uterus appears enlarged with asymmetric thickening of the anterior wall of the upper to mid uterus especially to the RIGHT with slight mass effect upon the endometrial stripe and heterogeneity of myometrial echogenicity, question subtle isoechoic leiomyoma up to 4.9 cm diameter versus potentially focal adenomyosis.   Additional question small posterior intramural leiomyoma 14 mm diameter   If clinically indicated, uterus could be better assessed by MR to exclude focal mass.   11/07/2018 Diagnosis Endometrium, curettage - BENIGN ENDOMETRIOID POLYP - NON-POLYPOID BENIGN INACTIVE ENDOMETRIUM - FRAGMENTS OF BENIGN CERVICAL SQUAMOUS MUCOSA - NEGATIVE FOR HYPERPLASIA OR MALIGNANCY Assessment & Plan:  Pelvic pain. Suspect adenomyosis. Reviewed management options. Pt wants definitive treatment.   Patient desires surgical management with Total vaginal hysterectomy and bilateral salpingectomy.  The risks of surgery were discussed in detail with the patient including but not limited to: bleeding which may require transfusion or reoperation; infection which may require prolonged hospitalization or re-hospitalization and  antibiotic  therapy; injury to bowel, bladder, ureters and major vessels or other surrounding organs; need for additional procedures including laparotomy; thromboembolic phenomenon, incisional problems and other postoperative or anesthesia complications.  Patient was told that the likelihood that her condition and symptoms will be treated effectively with this surgical management was very high; the postoperative expectations were also discussed in detail. The patient also understands the alternative treatment options which were discussed in full. All questions were answered.  She was told that she will be contacted by our surgical scheduler regarding the time and date of her surgery; routine preoperative instructions of having nothing to eat or drink after midnight on the day prior to surgery and also coming to the hospital 1 1/2 hours prior to her time of surgery were also emphasized.  She was told she may be called for a preoperative appointment about a week prior to surgery and will be given further preoperative instructions at that visit. Printed patient education handouts about the procedure were given to the patient to review at home.   Total face-to-face time with patient, review of chart, discussion with consultant and coordination of care was 37min.    Juanette Urizar L. Harraway-Smith, M.D., [facility] June

## 2021-04-09 ENCOUNTER — Other Ambulatory Visit (HOSPITAL_BASED_OUTPATIENT_CLINIC_OR_DEPARTMENT_OTHER): Payer: Self-pay

## 2021-04-15 ENCOUNTER — Encounter: Payer: Self-pay | Admitting: Obstetrics & Gynecology

## 2021-04-20 ENCOUNTER — Other Ambulatory Visit: Payer: Self-pay

## 2021-04-20 ENCOUNTER — Ambulatory Visit (HOSPITAL_BASED_OUTPATIENT_CLINIC_OR_DEPARTMENT_OTHER)
Admission: RE | Admit: 2021-04-20 | Discharge: 2021-04-20 | Disposition: A | Discharge status: Home / Self Care | Payer: 59 | Source: Ambulatory Visit | Attending: Obstetrics & Gynecology | Admitting: Obstetrics & Gynecology

## 2021-04-20 DIAGNOSIS — N852 Hypertrophy of uterus: Secondary | ICD-10-CM | POA: Diagnosis not present

## 2021-04-20 DIAGNOSIS — R102 Pelvic and perineal pain: Secondary | ICD-10-CM | POA: Diagnosis not present

## 2021-04-20 DIAGNOSIS — N83202 Unspecified ovarian cyst, left side: Secondary | ICD-10-CM | POA: Diagnosis not present

## 2021-05-18 ENCOUNTER — Other Ambulatory Visit: Payer: Self-pay

## 2021-05-18 ENCOUNTER — Encounter: Payer: Self-pay | Admitting: Obstetrics & Gynecology

## 2021-05-18 ENCOUNTER — Ambulatory Visit: Payer: 59 | Admitting: Obstetrics & Gynecology

## 2021-05-18 ENCOUNTER — Other Ambulatory Visit (HOSPITAL_COMMUNITY)
Admission: RE | Admit: 2021-05-18 | Discharge: 2021-05-18 | Disposition: A | Discharge status: Home / Self Care | Payer: 59 | Source: Ambulatory Visit | Attending: Obstetrics & Gynecology | Admitting: Obstetrics & Gynecology

## 2021-05-18 ENCOUNTER — Ambulatory Visit (INDEPENDENT_AMBULATORY_CARE_PROVIDER_SITE_OTHER): Payer: 59 | Admitting: Obstetrics & Gynecology

## 2021-05-18 VITALS — BP 155/91 | HR 64 | Ht 67.0 in | Wt 222.0 lb

## 2021-05-18 DIAGNOSIS — Z01419 Encounter for gynecological examination (general) (routine) without abnormal findings: Secondary | ICD-10-CM | POA: Diagnosis not present

## 2021-05-18 DIAGNOSIS — Z1231 Encounter for screening mammogram for malignant neoplasm of breast: Secondary | ICD-10-CM | POA: Diagnosis not present

## 2021-05-18 DIAGNOSIS — N8003 Adenomyosis of the uterus: Secondary | ICD-10-CM

## 2021-05-18 DIAGNOSIS — R030 Elevated blood-pressure reading, without diagnosis of hypertension: Secondary | ICD-10-CM | POA: Diagnosis not present

## 2021-05-18 DIAGNOSIS — R102 Pelvic and perineal pain: Secondary | ICD-10-CM

## 2021-05-18 NOTE — Progress Notes (Signed)
Pt called insurance company to confirm that it is ok to have her annual today because her last annual was 05/19/21. Per pt,her [employer] is ok with her having her annual a day early. She was given call confirmation # [phone].  Joshawa Dubin l Amal Saiki, CMA

## 2021-05-18 NOTE — Progress Notes (Signed)
Subjective:     Carmen Porter is a 55 y.o. female here for a routine exam.  Current complaints: 2 weeks of unbearable pain each month. Sx sl better with OCPs. She reports that she still has pain in the pelvis for 2 weeks of every month. She is already scheduled for hysterectomy in Feb.   Gynecologic History No LMP recorded. Patient has had an ablation. Contraception: post menopausal status Last Pap: 02/23/2018. Results were: normal Last mammogram: 03/2018. Results were: normal  Obstetric History OB History  Gravida Para Term Preterm AB Living  5 4 4   1 4   SAB IAB Ectopic Multiple Live Births    1     4    # Outcome Date GA Lbr Len/2nd Weight Sex Delivery Anes PTL Lv  5 Term 2003 [redacted]w[redacted]d   F Vag-Spont   LIV  4 Term 1999 [redacted]w[redacted]d   F Vag-Spont   LIV  3 IAB 1991          2 Term 8 [redacted]w[redacted]d   M Vag-Spont   LIV  1 Term 26 [redacted]w[redacted]d   F Vag-Spont   LIV     The following portions of the patient's history were reviewed and updated as appropriate: allergies, current medications, past family history, past medical history, past social history, past surgical history, and problem list.  Review of Systems Pertinent items are noted in HPI.    Objective:  BP (!) 155/91    Pulse 64    Ht 5\' 7"  (1.702 m)    Wt 222 lb (100.7 kg)    BMI 34.77 kg/m  General Appearance:    Alert, cooperative, no distress, appears stated age  Head:    Normocephalic, without obvious abnormality, atraumatic  Eyes:    conjunctiva/corneas clear, EOM's intact, both eyes  Ears:    Normal external ear canals, both ears  Nose:   Nares normal, septum midline, mucosa normal, no drainage    or sinus tenderness  Throat:   Lips, mucosa, and tongue normal; teeth and gums normal  Neck:   Supple, symmetrical, trachea midline, no adenopathy;    thyroid:  no enlargement/tenderness/nodules  Back:     Symmetric, no curvature, ROM normal, no CVA tenderness  Lungs:     respirations unlabored  Chest Wall:    No tenderness or deformity    Heart:    Regular rate and rhythm  Breast Exam:    No tenderness, masses, or nipple abnormality  Abdomen:     Soft, non-tender, bowel sounds active all four quadrants,    no masses, no organomegaly  Genitalia:    Normal female without lesion, discharge or tenderness     Extremities:   Extremities normal, atraumatic, no cyanosis or edema  Pulses:   2+ and symmetric all extremities  Skin:   Skin color, texture, turgor normal, no rashes or lesions    04/20/2021 CLINICAL DATA:  Pelvic pain   EXAM: TRANSABDOMINAL AND TRANSVAGINAL ULTRASOUND OF PELVIS   DOPPLER ULTRASOUND OF OVARIES   TECHNIQUE: Both transabdominal and transvaginal ultrasound examinations of the pelvis were performed. Transabdominal technique was performed for global imaging of the pelvis including uterus, ovaries, adnexal regions, and pelvic cul-de-sac.   It was necessary to proceed with endovaginal exam following the transabdominal exam to visualize the ovaries. Color and duplex Doppler ultrasound was utilized to evaluate blood flow to the ovaries.   COMPARISON:  None.   FINDINGS: Uterus   Measurements: 11.2 x 7.2 x 9.9 cm =  volume: 418.9 mL. There is markedly inhomogeneous echogenicity in myometrium. There are small hypoechoic foci each measuring less than 4 cm. The margins are indistinct.   Endometrium   Thickness: 6 mm.  No focal abnormality visualized.   Right ovary   Not sonographically visualized.   Left ovary   Measurements: 4.2 x 2.4 x 2.6 cm = volume: 13.9 mL. There is 3.1 x 1.9 cm anechoic structure in the left ovary, possibly dominant follicle or functional cyst.   Pulsed Doppler evaluation of both ovaries demonstrates vascular flow in the left adnexa.   Other findings   Trace amount of free fluid is seen.   IMPRESSION: Enlarged uterus with inhomogeneous echogenicity in the myometrium. There are ill-defined hypoechoic foci in the myometrium, possibly suggesting fibroids.   Right  ovary is not sonographically visualized. There is 3.1 cm cyst in the left adnexa suggesting dominant follicle or functional cyst. Trace amount of free fluid may suggest recent rupture of ovarian cyst or follicle.   Assessment:    Healthy female exam.  Pt s/p endometrial ablaation with pelvic pain following   Plan:  Diagnoses and all orders for this visit:  Well female exam with routine gynecological exam -     Cytology - PAP( [facility])  Elevated blood pressure reading  Pelvic pain in female  Breast cancer screening by mammogram -     Cancel: MM DIGITAL SCREENING BILATERAL; Future -     MM 3D SCREEN BREAST BILATERAL; Future  Adenomyosis of uterus    Patient desires surgical management with Total vaginal hysterectomy with bilateral salpingectomy .  The risks of surgery were discussed in detail with the patient including but not limited to: bleeding which may require transfusion or reoperation; infection which may require prolonged hospitalization or re-hospitalization and antibiotic therapy; injury to bowel, bladder, ureters and major vessels or other surrounding organs; need for additional procedures including laparotomy; thromboembolic phenomenon, incisional problems and other postoperative or anesthesia complications.  Patient was told that the likelihood that her condition and symptoms will be treated effectively with this surgical management was very high; the postoperative expectations were also discussed in detail. The patient also understands the alternative treatment options which were discussed in full. All questions were answered.  She was told that she will be contacted by our surgical scheduler regarding the time and date of her surgery; routine preoperative instructions of having nothing to eat or drink after midnight on the day prior to surgery and also coming to the hospital 1 1/2 hours prior to her time of surgery were also emphasized.  She was told she may be called for  a preoperative appointment about a week prior to surgery and will be given further preoperative instructions at that visit. Printed patient education handouts about the procedure were given to the patient to review at home.   Kathelyn Gombos L. Harraway-Smith, M.D., Cherlynn June

## 2021-05-19 ENCOUNTER — Encounter (HOSPITAL_BASED_OUTPATIENT_CLINIC_OR_DEPARTMENT_OTHER): Payer: Self-pay

## 2021-05-19 ENCOUNTER — Ambulatory Visit (HOSPITAL_BASED_OUTPATIENT_CLINIC_OR_DEPARTMENT_OTHER)
Admission: RE | Admit: 2021-05-19 | Discharge: 2021-05-19 | Disposition: A | Discharge status: Home / Self Care | Payer: 59 | Source: Ambulatory Visit | Attending: Obstetrics & Gynecology | Admitting: Obstetrics & Gynecology

## 2021-05-19 DIAGNOSIS — Z1231 Encounter for screening mammogram for malignant neoplasm of breast: Secondary | ICD-10-CM | POA: Diagnosis not present

## 2021-05-20 ENCOUNTER — Other Ambulatory Visit: Payer: Self-pay | Admitting: Obstetrics & Gynecology

## 2021-05-20 DIAGNOSIS — R928 Other abnormal and inconclusive findings on diagnostic imaging of breast: Secondary | ICD-10-CM

## 2021-05-21 LAB — CYTOLOGY - PAP
Comment: NEGATIVE
Diagnosis: NEGATIVE
High risk HPV: NEGATIVE

## 2021-05-27 ENCOUNTER — Ambulatory Visit: Payer: 59 | Admitting: Obstetrics & Gynecology

## 2021-06-11 ENCOUNTER — Encounter: Payer: Self-pay | Admitting: Gastroenterology

## 2021-06-18 ENCOUNTER — Encounter: Payer: Self-pay | Admitting: General Practice

## 2021-06-26 ENCOUNTER — Other Ambulatory Visit: Payer: Self-pay

## 2021-06-26 ENCOUNTER — Encounter (HOSPITAL_BASED_OUTPATIENT_CLINIC_OR_DEPARTMENT_OTHER): Payer: Self-pay | Admitting: Obstetrics & Gynecology

## 2021-06-26 DIAGNOSIS — N939 Abnormal uterine and vaginal bleeding, unspecified: Secondary | ICD-10-CM | POA: Diagnosis not present

## 2021-06-26 DIAGNOSIS — Z01812 Encounter for preprocedural laboratory examination: Secondary | ICD-10-CM | POA: Diagnosis not present

## 2021-06-26 DIAGNOSIS — R102 Pelvic and perineal pain: Secondary | ICD-10-CM | POA: Diagnosis not present

## 2021-06-26 NOTE — Progress Notes (Addendum)
Spoke w/ via phone for pre-op interview---pt Lab needs dos----  urine pregnancy POCT per anesthesia, surgeon orders pending as of 06/26/21            Lab results------07/06/21 appt for CBC, type & screen COVID test ----- Patient will get COVID test on 07/03/21. She will be returning from a cruise to the [place]. Arrive at -------0530 on 07/07/21 NPO after MN NO Solid Food.  Clear liquids from MN until---0430 Med rec completed Medications to take morning of surgery -----Gabapentin prn, Loestrin Diabetic medication -----n/a Patient instructed no nail polish to be worn day of surgery Patient instructed to bring photo id and insurance card day of surgery Patient aware to have Driver (ride ) / caregiver    for 24 hours after surgery [place] driver, Mom to be caregiver Patient Special Instructions -----Extended recovery instructions given. Pre-Op special Istructions -----Orders requested for Dr. Ihor Dow via Epic IB on 06/26/21. Patient verbalized understanding of instructions that were given at this phone interview. Patient denies shortness of breath, chest pain, fever, cough at this phone interview.    07/06/21 Update Patient did not get Covid test on 07/03/21. She is getting Covid test at [facility] on 07/06/21.

## 2021-06-26 NOTE — Progress Notes (Signed)
PLEASE WEAR A MASK OUT IN PUBLIC AND SOCIAL DISTANCE AND [place] YOUR HANDS FREQUENTLY. PLEASE ASK ALL YOUR CLOSE HOUSEHOLD CONTACT TO WEAR MASK OUT IN PUBLIC AND SOCIAL DISTANCE AND [place] HANDS FREQUENTLY ALSO.      Your procedure is scheduled on Tuesday, 07/07/21.  Report to [facility].   Call this number if you have problems the morning of surgery  :[phone].   OUR ADDRESS IS [address].  WE ARE LOCATED IN [facility].  PLEASE BRING YOUR INSURANCE CARD AND PHOTO ID DAY OF SURGERY.  ONLY ONE PERSON ALLOWED IN FACILITY WAITING AREA.                                     REMEMBER:  DO NOT EAT FOOD, CANDY GUM OR MINTS  AFTER MIDNIGHT THE NIGHT BEFORE YOUR SURGERY . YOU MAY HAVE CLEAR LIQUIDS FROM MIDNIGHT THE NIGHT BEFORE YOUR SURGERY UNTIL 4:30 AM NO CLEAR LIQUIDS AFTER   4:30 AM DAY OF SURGERY.   YOU MAY  BRUSH YOUR TEETH MORNING OF SURGERY AND RINSE YOUR MOUTH OUT, NO CHEWING GUM CANDY OR MINTS.    CLEAR LIQUID DIET   Foods Allowed                                                                     Foods Excluded  Coffee and tea, regular and decaf                             liquids that you cannot  Plain Jell-O any favor except red or purple                                           see through such as: Fruit ices (not with fruit pulp)                                     milk, soups, orange juice  Iced Popsicles                                    All solid food Carbonated beverages, regular and diet                                    Cranberry, grape and apple juices Sports drinks like Gatorade  Sample Menu Breakfast                                Lunch  Supper Cranberry juice                                           Jell-O                                     Grape juice                           Apple juice Coffee or tea                        Jell-O                                       Popsicle                                                Coffee or tea                        Coffee or tea  _____________________________________________________________________     TAKE THESE MEDICATIONS MORNING OF SURGERY WITH A SIP OF WATER:  Loestrin, Gabapentin as needed  ONE VISITOR IS ALLOWED IN WAITING ROOM ONLY DAY OF SURGERY.  YOU MAY HAVE ANOTHER PERSON SWITCH OUT WITH THE  1  VISITOR IN THE WAITING ROOM DAY OF SURGERY AND A MASK MUST BE WORN IN THE WAITING ROOM.    2 VISITORS  MAY VISIT IN THE EXTENDED RECOVERY ROOM UNTIL 800 PM ONLY 1 VISITOR AGE 54 AND OVER MAY SPEND THE NIGHT AND MUST BE IN EXTENDED RECOVERY ROOM NO LATER THAN 800 PM .    UP TO 2 CHILDREN AGE 54 TO 15 MAY ALSO VISIT IN EXTENDED RECOV ERY ROOM ONLY UNTIL 800 PM AND MUST LEAVE BY 800 PM.   ALL PERSONS VISITING IN EXTENDED RECOVERY ROOM MUST WEAR A MASK.                                    DO NOT WEAR JEWERLY, MAKE UP. DO NOT WEAR LOTIONS, POWDERS, PERFUMES OR NAIL POLISH ON YOUR FINGERNAILS. TOENAIL POLISH IS OK TO WEAR. DO NOT SHAVE FOR 48 HOURS PRIOR TO DAY OF SURGERY. MEN MAY SHAVE FACE AND NECK. CONTACTS, GLASSES, OR DENTURES MAY NOT BE WORN TO SURGERY.                                    [facility] IS NOT RESPONSIBLE  FOR ANY BELONGINGS.                                                                    [facility]  [facility] [facility] - Preparing for Surgery Before surgery, you can play an important role.  Because skin is not sterile, your skin needs to be as free of germs as possible.  You can reduce the number of germs on your skin by washing with CHG (chlorahexidine gluconate) soap before surgery.  CHG is an antiseptic cleaner which kills germs and bonds with the skin to continue killing germs even after washing. Please DO NOT use if you have an allergy to CHG or antibacterial soaps.  If your skin becomes reddened/irritated stop using the CHG and inform your nurse when you arrive at Short Stay. Do not  shave (including legs and underarms) for at least 48 hours prior to the first CHG shower.  You may shave your face/neck. Please follow these instructions carefully:  1.  Shower with CHG Soap the night before surgery and the  morning of Surgery.  2.  If you choose to wash your hair, wash your hair first as usual with your  normal  shampoo.  3.  After you shampoo, rinse your hair and body thoroughly to remove the  shampoo.                            4.  Use CHG as you would any other liquid soap.  You can apply chg directly  to the skin and wash                      Gently with a scrungie or clean washcloth.  5.  Apply the CHG Soap to your body ONLY FROM THE NECK DOWN.   Do not use on face/ open                           Wound or open sores. Avoid contact with eyes, ears mouth and genitals (private parts).                       Wash face,  Genitals (private parts) with your normal soap.             6.  Wash thoroughly, paying special attention to the area where your surgery  will be performed.  7.  Thoroughly rinse your body with warm water from the neck down.  8.  DO NOT shower/wash with your normal soap after using and rinsing off  the CHG Soap.                9.  Pat yourself dry with a clean towel.            10.  Wear clean pajamas.            11.  Place clean sheets on your bed the night of your first shower and do not  sleep with pets. Day of Surgery : Do not apply any lotions/deodorants the morning of surgery.  Please wear clean clothes to the hospital/surgery center.  IF YOU HAVE ANY SKIN IRRITATION OR PROBLEMS WITH THE SURGICAL SOAP, PLEASE GET A BAR OF GOLD DIAL SOAP AND SHOWER THE NIGHT BEFORE YOUR SURGERY AND THE MORNING OF YOUR SURGERY. PLEASE LET THE NURSE KNOW MORNING OF YOUR SURGERY IF YOU HAD ANY PROBLEMS WITH THE SURGICAL SOAP.   ________________________________________________________________________  QUESTIONS CALL Devoiry Corriher  PRE OP NURSE PHONE [phone].

## 2021-07-06 ENCOUNTER — Other Ambulatory Visit: Payer: Self-pay | Admitting: Obstetrics & Gynecology

## 2021-07-06 ENCOUNTER — Other Ambulatory Visit: Payer: Self-pay

## 2021-07-06 ENCOUNTER — Encounter (HOSPITAL_COMMUNITY)
Admission: RE | Admit: 2021-07-06 | Discharge: 2021-07-06 | Disposition: A | Discharge status: Home / Self Care | Payer: 59 | Source: Ambulatory Visit | Attending: Obstetrics & Gynecology | Admitting: Obstetrics & Gynecology

## 2021-07-06 ENCOUNTER — Ambulatory Visit
Admission: RE | Admit: 2021-07-06 | Discharge: 2021-07-06 | Disposition: A | Discharge status: Home / Self Care | Payer: 59 | Source: Ambulatory Visit | Attending: Obstetrics & Gynecology | Admitting: Obstetrics & Gynecology

## 2021-07-06 ENCOUNTER — Encounter (HOSPITAL_COMMUNITY): Payer: 59

## 2021-07-06 DIAGNOSIS — Z01812 Encounter for preprocedural laboratory examination: Secondary | ICD-10-CM | POA: Diagnosis not present

## 2021-07-06 DIAGNOSIS — Z01818 Encounter for other preprocedural examination: Secondary | ICD-10-CM

## 2021-07-06 DIAGNOSIS — N6322 Unspecified lump in the left breast, upper inner quadrant: Secondary | ICD-10-CM | POA: Diagnosis not present

## 2021-07-06 DIAGNOSIS — R102 Pelvic and perineal pain: Secondary | ICD-10-CM | POA: Insufficient documentation

## 2021-07-06 DIAGNOSIS — R928 Other abnormal and inconclusive findings on diagnostic imaging of breast: Secondary | ICD-10-CM

## 2021-07-06 DIAGNOSIS — N939 Abnormal uterine and vaginal bleeding, unspecified: Secondary | ICD-10-CM | POA: Diagnosis not present

## 2021-07-06 LAB — CBC
HCT: 40 % (ref 36.0–46.0)
Hemoglobin: 12.2 g/dL (ref 12.0–15.0)
MCH: 26.3 pg (ref 26.0–34.0)
MCHC: 30.5 g/dL (ref 30.0–36.0)
MCV: 86.4 fL (ref 80.0–100.0)
Platelets: 209 10*3/uL (ref 150–400)
RBC: 4.63 MIL/uL (ref 3.87–5.11)
RDW: 13.7 % (ref 11.5–15.5)
WBC: 4.4 10*3/uL (ref 4.0–10.5)
nRBC: 0 % (ref 0.0–0.2)

## 2021-07-06 LAB — TYPE AND SCREEN
ABO/RH(D): A POS
Antibody Screen: NEGATIVE

## 2021-07-06 LAB — SARS CORONAVIRUS 2 (TAT 6-24 HRS): SARS Coronavirus 2: NEGATIVE

## 2021-07-06 NOTE — Anesthesia Preprocedure Evaluation (Addendum)
Anesthesia Evaluation  Patient identified by MRN, date of birth, ID band Patient awake  General Assessment Comment:the only problem with anesthesia that she has ever had was being slow to wake up from her colonscopy in 2019  Reviewed: Allergy & Precautions, H&P , NPO status , Patient's Chart, lab work & pertinent test results  History of Anesthesia Complications (+) PROLONGED EMERGENCE and history of anesthetic complications  Airway Mallampati: I  TM Distance: >3 FB Neck ROM: Full    Dental no notable dental hx. (+) Teeth Intact, Dental Advisory Given, Missing,    Pulmonary neg pulmonary ROS,    Pulmonary exam normal breath sounds clear to auscultation       Cardiovascular Exercise Tolerance: Good hypertension, Normal cardiovascular exam Rhythm:Regular Rate:Normal     Neuro/Psych negative neurological ROS  negative psych ROS   GI/Hepatic negative GI ROS, Neg liver ROS,   Endo/Other  Morbid obesity  Renal/GU negative Renal ROS  negative genitourinary   Musculoskeletal negative musculoskeletal ROS (+) Arthritis , Osteoarthritis,    Abdominal (+) + obese,   Peds negative pediatric ROS (+)  Hematology  (+) Blood dyscrasia, anemia ,   Anesthesia Other Findings   Reproductive/Obstetrics negative OB ROS                            Anesthesia Physical Anesthesia Plan  ASA: 2  Anesthesia Plan: General   Post-op Pain Management: Ofirmev IV (intra-op) and Toradol IV (intra-op)   Induction: Intravenous  PONV Risk Score and Plan: 3 and Ondansetron, Dexamethasone and Treatment may vary due to age or medical condition  Airway Management Planned: Oral ETT  Additional Equipment: None  Intra-op Plan:   Post-operative Plan: Extubation in OR  Informed Consent: I have reviewed the patients History and Physical, chart, labs and discussed the procedure including the risks, benefits and alternatives  for the proposed anesthesia with the patient or authorized representative who has indicated his/her understanding and acceptance.       Plan Discussed with: Anesthesiologist and CRNA  Anesthesia Plan Comments: (  )        Anesthesia Quick Evaluation

## 2021-07-07 ENCOUNTER — Ambulatory Visit (HOSPITAL_BASED_OUTPATIENT_CLINIC_OR_DEPARTMENT_OTHER)
Admission: RE | Admit: 2021-07-07 | Discharge: 2021-07-08 | Disposition: A | Discharge status: Home / Self Care | Payer: 59 | Attending: Obstetrics & Gynecology | Admitting: Obstetrics & Gynecology

## 2021-07-07 ENCOUNTER — Encounter (HOSPITAL_BASED_OUTPATIENT_CLINIC_OR_DEPARTMENT_OTHER): Payer: Self-pay

## 2021-07-07 ENCOUNTER — Other Ambulatory Visit (HOSPITAL_BASED_OUTPATIENT_CLINIC_OR_DEPARTMENT_OTHER): Payer: Self-pay

## 2021-07-07 ENCOUNTER — Encounter (HOSPITAL_BASED_OUTPATIENT_CLINIC_OR_DEPARTMENT_OTHER): Payer: Self-pay | Admitting: Obstetrics & Gynecology

## 2021-07-07 ENCOUNTER — Other Ambulatory Visit: Payer: Self-pay

## 2021-07-07 ENCOUNTER — Encounter (HOSPITAL_BASED_OUTPATIENT_CLINIC_OR_DEPARTMENT_OTHER)
Admission: RE | Disposition: A | Discharge status: Home / Self Care | Payer: Self-pay | Source: Home / Self Care | Attending: Obstetrics & Gynecology

## 2021-07-07 ENCOUNTER — Ambulatory Visit (HOSPITAL_BASED_OUTPATIENT_CLINIC_OR_DEPARTMENT_OTHER): Payer: 59 | Admitting: Anesthesiology

## 2021-07-07 DIAGNOSIS — D649 Anemia, unspecified: Secondary | ICD-10-CM | POA: Diagnosis not present

## 2021-07-07 DIAGNOSIS — I10 Essential (primary) hypertension: Secondary | ICD-10-CM | POA: Diagnosis not present

## 2021-07-07 DIAGNOSIS — D251 Intramural leiomyoma of uterus: Secondary | ICD-10-CM | POA: Diagnosis not present

## 2021-07-07 DIAGNOSIS — Z01818 Encounter for other preprocedural examination: Secondary | ICD-10-CM

## 2021-07-07 DIAGNOSIS — Z6835 Body mass index (BMI) 35.0-35.9, adult: Secondary | ICD-10-CM | POA: Insufficient documentation

## 2021-07-07 DIAGNOSIS — Z9889 Other specified postprocedural states: Secondary | ICD-10-CM | POA: Diagnosis present

## 2021-07-07 DIAGNOSIS — N939 Abnormal uterine and vaginal bleeding, unspecified: Secondary | ICD-10-CM | POA: Diagnosis not present

## 2021-07-07 DIAGNOSIS — R102 Pelvic and perineal pain: Secondary | ICD-10-CM | POA: Insufficient documentation

## 2021-07-07 DIAGNOSIS — D500 Iron deficiency anemia secondary to blood loss (chronic): Secondary | ICD-10-CM | POA: Diagnosis present

## 2021-07-07 DIAGNOSIS — N8003 Adenomyosis of the uterus: Secondary | ICD-10-CM | POA: Insufficient documentation

## 2021-07-07 DIAGNOSIS — N72 Inflammatory disease of cervix uteri: Secondary | ICD-10-CM | POA: Insufficient documentation

## 2021-07-07 DIAGNOSIS — N879 Dysplasia of cervix uteri, unspecified: Secondary | ICD-10-CM | POA: Diagnosis not present

## 2021-07-07 HISTORY — PX: VAGINAL HYSTERECTOMY: SHX2639

## 2021-07-07 LAB — CBC
HCT: 41.6 % (ref 36.0–46.0)
Hemoglobin: 12.8 g/dL (ref 12.0–15.0)
MCH: 26.8 pg (ref 26.0–34.0)
MCHC: 30.8 g/dL (ref 30.0–36.0)
MCV: 87 fL (ref 80.0–100.0)
Platelets: 205 10*3/uL (ref 150–400)
RBC: 4.78 MIL/uL (ref 3.87–5.11)
RDW: 13.5 % (ref 11.5–15.5)
WBC: 4.2 10*3/uL (ref 4.0–10.5)
nRBC: 0 % (ref 0.0–0.2)

## 2021-07-07 LAB — POCT PREGNANCY, URINE: Preg Test, Ur: NEGATIVE

## 2021-07-07 LAB — HEMOGLOBIN AND HEMATOCRIT, BLOOD
HCT: 36.1 % (ref 36.0–46.0)
Hemoglobin: 11.4 g/dL — ABNORMAL LOW (ref 12.0–15.0)

## 2021-07-07 LAB — ABO/RH: ABO/RH(D): A POS

## 2021-07-07 SURGERY — HYSTERECTOMY, VAGINAL
Anesthesia: General | Site: Uterus | Laterality: Bilateral

## 2021-07-07 MED ORDER — TRAMADOL HCL 50 MG PO TABS
50.0000 mg | ORAL_TABLET | Freq: Four times a day (QID) | ORAL | Status: DC | PRN
  Administered 2021-07-07: 50 mg via ORAL

## 2021-07-07 MED ORDER — MENTHOL 3 MG MT LOZG: 1.0000 | LOZENGE | OROMUCOSAL | Status: DC | PRN

## 2021-07-07 MED ORDER — POVIDONE-IODINE 10 % EX SWAB: 2.0000 "application " | Freq: Once | CUTANEOUS | Status: DC

## 2021-07-07 MED ORDER — DOCUSATE SODIUM 100 MG PO CAPS
100.0000 mg | ORAL_CAPSULE | Freq: Two times a day (BID) | ORAL | Status: DC
  Administered 2021-07-07 (×2): 100 mg via ORAL

## 2021-07-07 MED ORDER — HYDROCODONE-ACETAMINOPHEN 5-325 MG PO TABS
1.0000 | ORAL_TABLET | Freq: Four times a day (QID) | ORAL | 0 refills | Status: DC | PRN
  Filled 2021-07-07: qty 20, 5d supply, fill #0

## 2021-07-07 MED ORDER — SODIUM CHLORIDE 0.9 % IV SOLN
INTRAVENOUS | Status: AC
  Filled 2021-07-07: qty 2

## 2021-07-07 MED ORDER — LIDOCAINE HCL (CARDIAC) PF 100 MG/5ML IV SOSY
PREFILLED_SYRINGE | INTRAVENOUS | Status: DC | PRN
  Administered 2021-07-07: 80 mg via INTRAVENOUS

## 2021-07-07 MED ORDER — PANTOPRAZOLE SODIUM 40 MG PO TBEC
DELAYED_RELEASE_TABLET | ORAL | Status: AC
  Filled 2021-07-07: qty 1

## 2021-07-07 MED ORDER — POLYETHYLENE GLYCOL 3350 17 G PO PACK: 17.0000 g | PACK | Freq: Every day | ORAL | Status: DC | PRN

## 2021-07-07 MED ORDER — ROCURONIUM BROMIDE 10 MG/ML (PF) SYRINGE
PREFILLED_SYRINGE | INTRAVENOUS | Status: AC
  Filled 2021-07-07: qty 10

## 2021-07-07 MED ORDER — TRAMADOL HCL 50 MG PO TABS
ORAL_TABLET | ORAL | Status: AC
  Filled 2021-07-07: qty 1

## 2021-07-07 MED ORDER — DOCUSATE SODIUM 100 MG PO CAPS
ORAL_CAPSULE | ORAL | Status: AC
  Filled 2021-07-07: qty 1

## 2021-07-07 MED ORDER — ACETAMINOPHEN 325 MG PO TABS
ORAL_TABLET | ORAL | Status: AC
  Filled 2021-07-07: qty 2

## 2021-07-07 MED ORDER — MEPERIDINE HCL 25 MG/ML IJ SOLN: 6.2500 mg | INTRAMUSCULAR | Status: DC | PRN

## 2021-07-07 MED ORDER — ONDANSETRON HCL 4 MG/2ML IJ SOLN
INTRAMUSCULAR | Status: AC
  Filled 2021-07-07: qty 2

## 2021-07-07 MED ORDER — SUGAMMADEX SODIUM 500 MG/5ML IV SOLN
INTRAVENOUS | Status: DC | PRN
Start: 2021-07-07 — End: 2021-07-07
  Administered 2021-07-07: 400 mg via INTRAVENOUS

## 2021-07-07 MED ORDER — PANTOPRAZOLE SODIUM 40 MG PO TBEC
40.0000 mg | DELAYED_RELEASE_TABLET | Freq: Every day | ORAL | Status: DC
  Administered 2021-07-07: 40 mg via ORAL

## 2021-07-07 MED ORDER — FENTANYL CITRATE (PF) 250 MCG/5ML IJ SOLN
INTRAMUSCULAR | Status: AC
  Filled 2021-07-07: qty 5

## 2021-07-07 MED ORDER — KETOROLAC TROMETHAMINE 30 MG/ML IJ SOLN
30.0000 mg | Freq: Once | INTRAMUSCULAR | Status: AC
  Administered 2021-07-07: 30 mg via INTRAVENOUS

## 2021-07-07 MED ORDER — SODIUM CHLORIDE (PF) 0.9 % IJ SOLN
INTRAMUSCULAR | Status: DC | PRN
  Administered 2021-07-07: 20 mL

## 2021-07-07 MED ORDER — DEXAMETHASONE SODIUM PHOSPHATE 10 MG/ML IJ SOLN
INTRAMUSCULAR | Status: AC
  Filled 2021-07-07: qty 1

## 2021-07-07 MED ORDER — LACTATED RINGERS IV SOLN: INTRAVENOUS | Status: DC

## 2021-07-07 MED ORDER — POTASSIUM CHLORIDE IN NACL 20-0.45 MEQ/L-% IV SOLN
INTRAVENOUS | Status: AC
  Filled 2021-07-07 (×3): qty 1000

## 2021-07-07 MED ORDER — OXYCODONE-ACETAMINOPHEN 5-325 MG PO TABS
ORAL_TABLET | ORAL | Status: AC
  Filled 2021-07-07: qty 1

## 2021-07-07 MED ORDER — ONDANSETRON HCL 4 MG PO TABS: 4.0000 mg | ORAL_TABLET | Freq: Four times a day (QID) | ORAL | Status: DC | PRN

## 2021-07-07 MED ORDER — KETOROLAC TROMETHAMINE 30 MG/ML IJ SOLN
INTRAMUSCULAR | Status: AC
  Filled 2021-07-07: qty 1

## 2021-07-07 MED ORDER — ONDANSETRON HCL 4 MG/2ML IJ SOLN
4.0000 mg | Freq: Four times a day (QID) | INTRAMUSCULAR | Status: DC | PRN
  Administered 2021-07-07: 4 mg via INTRAVENOUS

## 2021-07-07 MED ORDER — PROPOFOL 10 MG/ML IV BOLUS
INTRAVENOUS | Status: AC
  Filled 2021-07-07: qty 20

## 2021-07-07 MED ORDER — OXYCODONE HCL 5 MG/5ML PO SOLN: 5.0000 mg | Freq: Once | ORAL | Status: DC | PRN

## 2021-07-07 MED ORDER — BISACODYL 10 MG RE SUPP: 10.0000 mg | Freq: Every day | RECTAL | Status: DC | PRN

## 2021-07-07 MED ORDER — ACETAMINOPHEN 500 MG PO TABS
1000.0000 mg | ORAL_TABLET | ORAL | Status: AC
  Administered 2021-07-07: 1000 mg via ORAL

## 2021-07-07 MED ORDER — LIDOCAINE HCL (PF) 2 % IJ SOLN
INTRAMUSCULAR | Status: AC
  Filled 2021-07-07: qty 5

## 2021-07-07 MED ORDER — ROCURONIUM BROMIDE 100 MG/10ML IV SOLN
INTRAVENOUS | Status: DC | PRN
  Administered 2021-07-07: 70 mg via INTRAVENOUS

## 2021-07-07 MED ORDER — HYDROMORPHONE HCL 1 MG/ML IJ SOLN: 0.2000 mg | INTRAMUSCULAR | Status: DC | PRN

## 2021-07-07 MED ORDER — ONDANSETRON HCL 4 MG/2ML IJ SOLN: 4.0000 mg | Freq: Once | INTRAMUSCULAR | Status: DC | PRN

## 2021-07-07 MED ORDER — FENTANYL CITRATE (PF) 100 MCG/2ML IJ SOLN
INTRAMUSCULAR | Status: DC | PRN
  Administered 2021-07-07 (×4): 50 ug via INTRAVENOUS

## 2021-07-07 MED ORDER — IBUPROFEN 800 MG PO TABS
800.0000 mg | ORAL_TABLET | Freq: Three times a day (TID) | ORAL | 0 refills | Status: DC | PRN
  Filled 2021-07-07: qty 30, 10d supply, fill #0

## 2021-07-07 MED ORDER — PHENYLEPHRINE HCL (PRESSORS) 10 MG/ML IV SOLN
INTRAVENOUS | Status: AC
  Filled 2021-07-07: qty 1

## 2021-07-07 MED ORDER — VASOPRESSIN 20 UNIT/ML IV SOLN
INTRAVENOUS | Status: DC | PRN
  Administered 2021-07-07: 6.7 [IU]

## 2021-07-07 MED ORDER — ESTRADIOL 0.1 MG/GM VA CREA
TOPICAL_CREAM | VAGINAL | Status: DC | PRN
  Administered 2021-07-07: 1 via VAGINAL

## 2021-07-07 MED ORDER — 0.9 % SODIUM CHLORIDE (POUR BTL) OPTIME
TOPICAL | Status: DC | PRN
  Administered 2021-07-07: 500 mL

## 2021-07-07 MED ORDER — DEXAMETHASONE SODIUM PHOSPHATE 4 MG/ML IJ SOLN
INTRAMUSCULAR | Status: DC | PRN
  Administered 2021-07-07: 10 mg via INTRAVENOUS

## 2021-07-07 MED ORDER — ZOLPIDEM TARTRATE 5 MG PO TABS: 5.0000 mg | ORAL_TABLET | Freq: Every evening | ORAL | Status: DC | PRN

## 2021-07-07 MED ORDER — SODIUM CHLORIDE 0.9 % IV SOLN
2.0000 g | INTRAVENOUS | Status: AC
  Administered 2021-07-07: 2 g via INTRAVENOUS

## 2021-07-07 MED ORDER — KETOROLAC TROMETHAMINE 30 MG/ML IJ SOLN
30.0000 mg | Freq: Four times a day (QID) | INTRAMUSCULAR | Status: DC
  Administered 2021-07-07 – 2021-07-08 (×3): 30 mg via INTRAVENOUS

## 2021-07-07 MED ORDER — ACETAMINOPHEN 500 MG PO TABS
ORAL_TABLET | ORAL | Status: AC
  Filled 2021-07-07: qty 2

## 2021-07-07 MED ORDER — OXYCODONE HCL 5 MG PO TABS: 5.0000 mg | ORAL_TABLET | Freq: Once | ORAL | Status: DC | PRN

## 2021-07-07 MED ORDER — SOD CITRATE-CITRIC ACID 500-334 MG/5ML PO SOLN: 30.0000 mL | ORAL | Status: DC

## 2021-07-07 MED ORDER — MIDAZOLAM HCL 5 MG/5ML IJ SOLN
INTRAMUSCULAR | Status: DC | PRN
  Administered 2021-07-07: 2 mg via INTRAVENOUS

## 2021-07-07 MED ORDER — OXYCODONE-ACETAMINOPHEN 5-325 MG PO TABS
1.0000 | ORAL_TABLET | ORAL | Status: DC | PRN
  Administered 2021-07-07 – 2021-07-08 (×3): 1 via ORAL
  Administered 2021-07-08: 2 via ORAL

## 2021-07-07 MED ORDER — MIDAZOLAM HCL 2 MG/2ML IJ SOLN
INTRAMUSCULAR | Status: AC
  Filled 2021-07-07: qty 2

## 2021-07-07 MED ORDER — FENTANYL CITRATE (PF) 100 MCG/2ML IJ SOLN: 25.0000 ug | INTRAMUSCULAR | Status: DC | PRN

## 2021-07-07 MED ORDER — ACETAMINOPHEN 160 MG/5ML PO SOLN: 325.0000 mg | ORAL | Status: DC | PRN

## 2021-07-07 MED ORDER — PROPOFOL 10 MG/ML IV BOLUS
INTRAVENOUS | Status: DC | PRN
  Administered 2021-07-07: 150 mg via INTRAVENOUS
  Administered 2021-07-07: 50 mg via INTRAVENOUS

## 2021-07-07 MED ORDER — IBUPROFEN 200 MG PO TABS: 600.0000 mg | ORAL_TABLET | Freq: Four times a day (QID) | ORAL | Status: DC

## 2021-07-07 MED ORDER — LACTATED RINGERS IV SOLN
INTRAVENOUS | Status: DC
  Administered 2021-07-07: 1000 mL via INTRAVENOUS

## 2021-07-07 MED ORDER — KETOROLAC TROMETHAMINE 30 MG/ML IJ SOLN: 30.0000 mg | Freq: Four times a day (QID) | INTRAMUSCULAR | Status: DC

## 2021-07-07 MED ORDER — ALUM & MAG HYDROXIDE-SIMETH 200-200-20 MG/5ML PO SUSP: 30.0000 mL | ORAL | Status: DC | PRN

## 2021-07-07 MED ORDER — ONDANSETRON HCL 4 MG/2ML IJ SOLN
INTRAMUSCULAR | Status: DC | PRN
  Administered 2021-07-07: 4 mg via INTRAVENOUS

## 2021-07-07 MED ORDER — ACETAMINOPHEN 325 MG PO TABS
325.0000 mg | ORAL_TABLET | ORAL | Status: DC | PRN
  Administered 2021-07-07: 650 mg via ORAL

## 2021-07-07 SURGICAL SUPPLY — 28 items
BAG DECANTER FOR FLEXI CONT (MISCELLANEOUS) ×1 IMPLANT
BLADE SURG 10 STRL SS (BLADE) ×1 IMPLANT
COVER MAYO STAND STRL (DRAPES) ×2 IMPLANT
ELECT REM PT RETURN 9FT ADLT (ELECTROSURGICAL) ×2
ELECTRODE REM PT RTRN 9FT ADLT (ELECTROSURGICAL) ×1 IMPLANT
GAUZE 4X4 16PLY ~~LOC~~+RFID DBL (SPONGE) ×4 IMPLANT
GAUZE PACKING 2X5 YD STRL (GAUZE/BANDAGES/DRESSINGS) ×2 IMPLANT
GLOVE SURG ENC MOIS LTX SZ7 (GLOVE) ×2 IMPLANT
GLOVE SURG UNDER POLY LF SZ6.5 (GLOVE) ×2 IMPLANT
GLOVE SURG UNDER POLY LF SZ7 (GLOVE) ×2 IMPLANT
GOWN STRL REUS W/TWL LRG LVL3 (GOWN DISPOSABLE) ×4 IMPLANT
KIT TURNOVER CYSTO (KITS) ×2 IMPLANT
NDL SAFETY ECLIPSE 18X1.5 (NEEDLE) IMPLANT
NDL SPNL 22GX3.5 QUINCKE BK (NEEDLE) IMPLANT
NEEDLE HYPO 18GX1.5 SHARP (NEEDLE) ×2
NEEDLE SPNL 22GX3.5 QUINCKE BK (NEEDLE) ×2 IMPLANT
PACK VAGINAL WOMENS (CUSTOM PROCEDURE TRAY) ×4 IMPLANT
PAD OB MATERNITY 4.3X12.25 (PERSONAL CARE ITEMS) ×2 IMPLANT
PAD PREP 24X48 CUFFED NSTRL (MISCELLANEOUS) ×2 IMPLANT
SPONGE T-LAP 4X18 ~~LOC~~+RFID (SPONGE) ×2 IMPLANT
SUT VIC AB 0 CT1 18XCR BRD8 (SUTURE) ×3 IMPLANT
SUT VIC AB 0 CT1 36 (SUTURE) ×4 IMPLANT
SUT VIC AB 0 CT1 8-18 (SUTURE) ×6
SUT VICRYL 0 TIES 12 18 (SUTURE) ×2 IMPLANT
SYR 5ML LL (SYRINGE) ×1 IMPLANT
TOWEL OR 17X26 10 PK STRL BLUE (TOWEL DISPOSABLE) ×2 IMPLANT
TRAY FOLEY W/BAG SLVR 14FR LF (SET/KITS/TRAYS/PACK) ×2 IMPLANT
WATER STERILE IRR 500ML POUR (IV SOLUTION) ×2 IMPLANT

## 2021-07-07 NOTE — Op Note (Signed)
07/07/2021  9:25 AM  PATIENT:  Carmen Porter  56 y.o. female  PRE-OPERATIVE DIAGNOSIS:  Pelvic Pain  POST-OPERATIVE DIAGNOSIS:  Pelvic Pain  PROCEDURE:  Procedure(s): HYSTERECTOMY VAGINAL WITH SALPINGECTOMY (Bilateral)  SURGEON:  Surgeon(s) and Role:    * Lavonia Drafts, MD - Primary    * Constant, Peggy, MD - Assisting  ANESTHESIA:   general  EBL:  250 mL   BLOOD ADMINISTERED:none  DRAINS: none   LOCAL MEDICATIONS USED:  OTHER dilute vasopressin  SPECIMEN:  Source of Specimen:  uterus and cervix with bilateral fallopian tubes  DISPOSITION OF SPECIMEN:  PATHOLOGY  COUNTS:  YES  TOURNIQUET:  * No tourniquets in log *  DICTATION: .Note written in EPIC  PLAN OF CARE: prolonged observation   PATIENT DISPOSITION:  PACU - hemodynamically stable.   Delay start of Pharmacological VTE agent (>24hrs) due to surgical blood loss or risk of bleeding: not applicable  Complications: none immediate.  INDICATIONS: The patient is a 56 y.o. [id number] with history of symptomatic uterine fibroids/menorrhagia. The patient made a decision to undergo definite surgical treatment. On the preoperative visit, the risks, benefits, indications, and alternatives of the procedure were reviewed with the patient.  On the day of surgery, the risks of surgery were again discussed with the patient including but not limited to: bleeding which may require transfusion or reoperation; infection which may require antibiotics; injury to bowel, bladder, ureters or other surrounding organs; need for additional procedures; thromboembolic phenomenon, incisional problems and other postoperative/anesthesia complications. Written informed consent was obtained.    OPERATIVE FINDINGS: A 12 week size uterus with normal tubes and ovaries bilaterally. There was a clear cyst extending out of the posterior left side of the uterus. It was not attached to any other structure.   DESCRIPTION OF PROCEDURE:  The patient  received intravenous antibiotics and had sequential compression devices applied to her lower extremities while in the preoperative area.  She was then taken to the operating room where general anesthesia was administered and was found to be adequate.  She was placed in the dorsal lithotomy position, and was prepped and draped in a sterile manner. A Foley catheter was inserted and connected to gravity drainage and drained clear urine throughout the procedure. After an adequate timeout was performed, attention was turned to her pelvis.  A weighted speculum was then placed in the vagina, and the anterior and posterior lips of the cervix were grasped bilaterally with tenaculums.  The cervix was then injected circumferentially with a dilute Vasopression solution.  The cervix was then circumferentially incised, and the bladder was dissected off the pubocervical fascia without complication.  Th posterior cul-de-sac was entered sharply without difficulty. A suture was placed on the posterior vagina.  A long weighted speculum was inserted into the posterior cul-de-sac.  The Heaney clamp was then used to clamp the uterosacral ligaments on either side.  They were then cut and sutured ligated with 0 Vicryl, and the ligated uterosacral ligaments were transfixed to the posterior lateral vaginal epithelium to further support the vagina and provide hemostasis. Of note, all sutures used in this case were 0 Vicryl.  The cardinal ligaments were then clamped, cut and ligated. The anterior cul-de-sac was then entered sharpely. The uterine vessels and broad ligaments were then serially clamped with the Heaney clamps, cut, and suture ligated on both sides.  Excellent hemostasis was noted at this point.  Due to the size of the uterus, it was morcellated using a coring technique.  The uterus was then delivered via the posterior cul-de-sac, and the cornua were clamped with the Heaney clamps, transected, and the uterus was delivered and sent  to pathology. These pedicles were then suture ligated to ensure hemostasis.  After completion of the hysterectomy, The fallopian tube on the left side was grasped with a Kelly clamp, transected and suture ligated.  The right fallopian tube was grasped clamped and suture ligated in similar fashion.  All pedicles from the uterosacral ligament to the cornua were examined hemostasis was confirmed.  The vaginal cuff was reefed in a running locked fashion then reapproximated using figure of eight sutures care was given to incorporate the uterosacral pedicles bilaterally.  All instruments were then removed from the pelvis and a vaginal packing saturated with estrogen cream was placed.  The patient tolerated the procedure well.  All instruments, needles, and sponge counts were correct x 2. The patient was taken to the recovery room in stable condition.    An experienced assistant was required given the standard of surgical care given the complexity of the case.  This assistant was needed for exposure, dissection, suctioning, retraction, instrument exchange, and for overall help during the procedure.  Isahia Hollerbach L. Harraway-Smith, M.D., Cherlynn June

## 2021-07-07 NOTE — Progress Notes (Signed)
Spoke with Colletta Maryland on the nurse line for Dr Ihor Dow she spoke with on call MD and the message was for [place] to call Dr Ihor Dow.  Colletta Maryland also tried to reach Dr Ihor Dow and was unable to reach her, she left a message also.

## 2021-07-07 NOTE — Transfer of Care (Signed)
Immediate Anesthesia Transfer of Care Note  Patient: Carmen Porter  Procedure(s) Performed: HYSTERECTOMY VAGINAL WITH SALPINGECTOMY (Bilateral: Uterus)  Patient Location: PACU  Anesthesia Type:General  Level of Consciousness: awake, alert  and oriented  Airway & Oxygen Therapy: Patient Spontanous Breathing and Patient connected to face mask oxygen  Post-op Assessment: Report given to RN and Post -op Vital signs reviewed and stable  Post vital signs: Reviewed and stable  Last Vitals:  Vitals Value Taken Time  BP 155/92 07/07/21 0930  Temp    Pulse 69 07/07/21 0934  Resp 22 07/07/21 0934  SpO2 100 % 07/07/21 0934  Vitals shown include unvalidated device data.  Last Pain:  Vitals:   07/07/21 0602  TempSrc:   PainSc: 0-No pain      Patients Stated Pain Goal: 5 (29/84/73 0856)  Complications: No notable events documented.

## 2021-07-07 NOTE — Discharge Instructions (Signed)

## 2021-07-07 NOTE — Progress Notes (Signed)
Patient in severe discomfort, unable to void, 14 french catheter placed patient had almost instant relief.  Waiting on return call from MD.

## 2021-07-07 NOTE — Progress Notes (Signed)
RN rounding on patient she c/o pressure increasing in lower abdomen even with the catheter in place.  Oxycodone 5/325 given.

## 2021-07-07 NOTE — Progress Notes (Signed)
Patient is resting feeling better after catheter placed.

## 2021-07-07 NOTE — H&P (Signed)
Preoperative History and Physical  Carmen Porter is a 56 y.o. [id number] here for surgical management of pelvic pain post endometrial ablation suspected adenomyosis. .   Proposed surgery: Total vaginal hysterectomy with probably bilateral salpingectomy  Past Medical History:  Diagnosis Date   Anemia 2019   hx of anemia due to very heavy periods, pt received iron infusions, pt had an endometrial ablation in 2020   Arthritis    right knee   Blood transfusion without reported diagnosis 2012   due to heavy menstrual periods   Complication of anesthesia    Complication from anesthesia was already marked as positive in Epic. Per 06/26/21 phone call with pt, the only problem with anesthesia that she has ever had was being slow to wake up from her colonscopy in 2019   Past Surgical History:  Procedure Laterality Date   COLONOSCOPY W/ POLYPECTOMY  04/17/2018   3 polyps removed   DILITATION & CURRETTAGE/HYSTROSCOPY WITH NOVASURE ABLATION N/A 11/07/2018   Procedure: DILATATION & CURETTAGE/HYSTEROSCOPY WITH NOVASURE ABLATION;  Surgeon: Lavonia Drafts, MD;  Location: [facility];  Service: Gynecology;  Laterality: N/A;   TUBAL LIGATION     OB History     Gravida  5   Para  4   Term  4   Preterm      AB  1   Living  4      SAB      IAB  1   Ectopic      Multiple      Live Births  4          Patient denies any cervical dysplasia or STIs. Medications Prior to Admission  Medication Sig Dispense Refill Last Dose   gabapentin (NEURONTIN) 100 MG capsule Take 1 capsule (100 mg total) by mouth daily as needed. 30 capsule 3 Past Month   Multiple Vitamin (MULTIVITAMIN) capsule Take 2 capsules by mouth daily.    07/07/2021   naproxen sodium (ALEVE) 220 MG tablet Take 440 mg by mouth daily as needed (pain).   Past Month   norethindrone-ethinyl estradiol (LOESTRIN 1/20, 21,) 1-20 MG-MCG tablet Take 1 tablet by mouth daily. 21 tablet 11 07/06/2021    No Known Allergies Social  History:   reports that she has never smoked. She has never used smokeless tobacco. She reports current alcohol use. She reports that she does not use drugs. Family History  Problem Relation Age of Onset   Stroke Father    Hypertension Father    Heart failure Father    Heart disease Father    Cancer Maternal Grandmother        cervical cancer   Cancer Maternal Grandfather        prostate cancer   Glaucoma Maternal Grandfather    Breast cancer Maternal Aunt    Colon cancer Neg Hx     Review of Systems: Noncontributory  PHYSICAL EXAM: Blood pressure (!) 157/93, pulse 72, temperature 98.8 F (37.1 C), temperature source Oral, resp. rate 17, height 5\' 7"  (1.702 m), weight 101.4 kg, SpO2 100 %. General appearance - alert, well appearing, and in no distress Chest - clear to auscultation, no wheezes, rales or rhonchi, symmetric air entry Heart - normal rate and regular rhythm Abdomen - soft, nontender, nondistended, no masses or organomegaly Pelvic - small; mobile done in office.  Extremities - peripheral pulses normal, no pedal edema, no clubbing or cyanosis  Labs: Results for orders placed or performed during the hospital encounter of 07/07/21 (from  the past 336 hour(s))  Pregnancy, urine POC   Collection Time: 07/07/21  5:38 AM  Result Value Ref Range   Preg Test, Ur NEGATIVE NEGATIVE  CBC   Collection Time: 07/07/21  6:18 AM  Result Value Ref Range   WBC 4.2 4.0 - 10.5 K/uL   RBC 4.78 3.87 - 5.11 MIL/uL   Hemoglobin 12.8 12.0 - 15.0 g/dL   HCT 41.6 36.0 - 46.0 %   MCV 87.0 80.0 - 100.0 fL   MCH 26.8 26.0 - 34.0 pg   MCHC 30.8 30.0 - 36.0 g/dL   RDW 13.5 11.5 - 15.5 %   Platelets 205 150 - 400 K/uL   nRBC 0.0 0.0 - 0.2 %  Results for orders placed or performed during the hospital encounter of 07/06/21 (from the past 336 hour(s))  CBC   Collection Time: 07/06/21 10:58 AM  Result Value Ref Range   WBC 4.4 4.0 - 10.5 K/uL   RBC 4.63 3.87 - 5.11 MIL/uL   Hemoglobin 12.2  12.0 - 15.0 g/dL   HCT 40.0 36.0 - 46.0 %   MCV 86.4 80.0 - 100.0 fL   MCH 26.3 26.0 - 34.0 pg   MCHC 30.5 30.0 - 36.0 g/dL   RDW 13.7 11.5 - 15.5 %   Platelets 209 150 - 400 K/uL   nRBC 0.0 0.0 - 0.2 %  Type and screen [place]   Collection Time: 07/06/21 10:58 AM  Result Value Ref Range   ABO/RH(D) A POS    Antibody Screen NEG    Sample Expiration 07/20/2021,2359    Extend sample reason      NO TRANSFUSIONS OR PREGNANCY IN THE PAST 3 MONTHS Performed at [facility], [place] [address], [place], [place] [address]   Results for orders placed or performed in visit on 07/06/21 (from the past 336 hour(s))  SARS Coronavirus 2 (TAT 6-24 hrs)   Collection Time: 07/06/21 12:00 AM  Result Value Ref Range   SARS Coronavirus 2 RESULT: NEGATIVE     Imaging Studies: US BREAST LTD UNI LEFT INC AXILLA  Result Date: 07/06/2021 CLINICAL DATA:  56 year old female recalled from screening mammogram dated 05/19/2021 for possible left breast masses. EXAM: DIGITAL DIAGNOSTIC UNILATERAL LEFT MAMMOGRAM WITH TOMOSYNTHESIS AND CAD; ULTRASOUND LEFT BREAST LIMITED TECHNIQUE: Left digital diagnostic mammography and breast tomosynthesis was performed. The images were evaluated with computer-aided detection.; Targeted ultrasound examination of the left breast was performed. COMPARISON:  Previous exam(s). ACR Breast Density Category b: There are scattered areas of fibroglandular density. FINDINGS: There are persistent oval, circumscribed equal density masses within the medial and lateral left breast. Further evaluation with ultrasound was performed. Targeted ultrasound is performed, showing an irregular hypoechoic mass with irregular margins at the 9:30 position 2 cm from the nipple on the left. It measures 1.3 x 1.1 x 1.3 cm. There is no internal vascularity. There is a round, circumscribed hypoechoic mass at the 3 o'clock position 7 cm from the nipple. It measures 7 x 6 x 6 mm. Both  masses correspond with the mammographic findings. Evaluation of the left axilla demonstrates no suspicious lymphadenopathy. IMPRESSION: 1. Indeterminate left breast mass at the 9:30 position 2 cm from the nipple. Recommendation is for ultrasound-guided biopsy. 2. Indeterminate left breast mass at the 3 o'clock position 7 cm from the nipple. Recommendation is for ultrasound-guided aspiration/biopsy. 3. No suspicious left axillary lymphadenopathy. RECOMMENDATION: Two area ultrasound-guided biopsy/aspiration of the left breast. I have discussed the findings and  recommendations with the patient. If applicable, a reminder letter will be sent to the patient regarding the next appointment. BI-RADS CATEGORY  4: Suspicious. Electronically Signed   By: Kristopher Oppenheim M.D.   On: 07/06/2021 10:38  MM DIAG BREAST TOMO UNI LEFT  Result Date: 07/06/2021 CLINICAL DATA:  57 year old female recalled from screening mammogram dated 05/19/2021 for possible left breast masses. EXAM: DIGITAL DIAGNOSTIC UNILATERAL LEFT MAMMOGRAM WITH TOMOSYNTHESIS AND CAD; ULTRASOUND LEFT BREAST LIMITED TECHNIQUE: Left digital diagnostic mammography and breast tomosynthesis was performed. The images were evaluated with computer-aided detection.; Targeted ultrasound examination of the left breast was performed. COMPARISON:  Previous exam(s). ACR Breast Density Category b: There are scattered areas of fibroglandular density. FINDINGS: There are persistent oval, circumscribed equal density masses within the medial and lateral left breast. Further evaluation with ultrasound was performed. Targeted ultrasound is performed, showing an irregular hypoechoic mass with irregular margins at the 9:30 position 2 cm from the nipple on the left. It measures 1.3 x 1.1 x 1.3 cm. There is no internal vascularity. There is a round, circumscribed hypoechoic mass at the 3 o'clock position 7 cm from the nipple. It measures 7 x 6 x 6 mm. Both masses correspond with the  mammographic findings. Evaluation of the left axilla demonstrates no suspicious lymphadenopathy. IMPRESSION: 1. Indeterminate left breast mass at the 9:30 position 2 cm from the nipple. Recommendation is for ultrasound-guided biopsy. 2. Indeterminate left breast mass at the 3 o'clock position 7 cm from the nipple. Recommendation is for ultrasound-guided aspiration/biopsy. 3. No suspicious left axillary lymphadenopathy. RECOMMENDATION: Two area ultrasound-guided biopsy/aspiration of the left breast. I have discussed the findings and recommendations with the patient. If applicable, a reminder letter will be sent to the patient regarding the next appointment. BI-RADS CATEGORY  4: Suspicious. Electronically Signed   By: Kristopher Oppenheim M.D.   On: 07/06/2021 10:38   Assessment: Patient Active Problem List   Diagnosis Date Noted   Post-operative state 07/07/2021   Abnormal uterine bleeding (AUB) 10/05/2018   Chronic pain of both knees 04/26/2018   Pain in both lower extremities 02/23/2018   Constipation 02/23/2018   External hemorrhoids without complication 10/62/6948   Left wrist pain 10/06/2012   Thyromegaly 10/06/2012   Routine general medical examination at a health care facility 10/06/2012   Iron deficiency anemia due to chronic blood loss 09/23/2011   Menorrhagia 09/23/2011    Plan: Patient will undergo surgical management with Total vaginal hysterectomy with probably bilateral salpingectomy .   The risks of surgery were discussed in detail with the patient including but not limited to: bleeding which may require transfusion or reoperation; infection which may require antibiotics; injury to surrounding organs which may involve bowel, bladder, ureters ; need for additional procedures including laparoscopy or laparotomy; thromboembolic phenomenon, surgical site problems and other postoperative/anesthesia complications. Likelihood of success in alleviating the patient's condition was discussed.  Routine postoperative instructions will be reviewed with the patient and her family in detail after surgery.  The patient concurred with the proposed plan, giving informed written consent for the surgery.  Patient has been NPO since last night she will remain NPO for procedure.  Anesthesia and OR aware.  Preoperative prophylactic antibiotics and SCDs ordered on call to the OR.  To OR when ready.  Daeja Helderman L. Ihor Dow, M.D., [facility] 07/07/2021 7:16 AM

## 2021-07-07 NOTE — Anesthesia Procedure Notes (Signed)
Procedure Name: Intubation Date/Time: 07/07/2021 7:55 AM Performed by: Verita Lamb, CRNA Pre-anesthesia Checklist: Patient identified, Emergency Drugs available, Suction available and Patient being monitored Patient Re-evaluated:Patient Re-evaluated prior to induction Oxygen Delivery Method: Circle system utilized Preoxygenation: Pre-oxygenation with 100% oxygen Induction Type: IV induction Ventilation: Mask ventilation without difficulty Laryngoscope Size: Mac and 4 Grade View: Grade I Tube type: Oral Tube size: 7.0 mm Number of attempts: 1 Airway Equipment and Method: Stylet and Oral airway Placement Confirmation: ETT inserted through vocal cords under direct vision, positive ETCO2 and breath sounds checked- equal and bilateral Secured at: 23 cm Tube secured with: Tape Dental Injury: Teeth and Oropharynx as per pre-operative assessment

## 2021-07-07 NOTE — Progress Notes (Signed)
RN pulled packing out.  Packing was moderately bloody.

## 2021-07-07 NOTE — Progress Notes (Signed)
RN spoke with Dr Ihor Dow updated on patient staying overnight and care given.  MD gave accurate phone [phone].

## 2021-07-07 NOTE — Anesthesia Postprocedure Evaluation (Signed)
Anesthesia Post Note  Patient: Carmen Porter  Procedure(s) Performed: HYSTERECTOMY VAGINAL WITH SALPINGECTOMY (Bilateral: Uterus)     Patient location during evaluation: PACU Anesthesia Type: General Level of consciousness: awake and alert Pain management: pain level controlled Vital Signs Assessment: post-procedure vital signs reviewed and stable Respiratory status: spontaneous breathing, nonlabored ventilation, respiratory function stable and patient connected to nasal cannula oxygen Cardiovascular status: blood pressure returned to baseline and stable Postop Assessment: no apparent nausea or vomiting Anesthetic complications: no   No notable events documented.  Last Vitals:  Vitals:   07/07/21 1030 07/07/21 1045  BP: (!) 157/94 (!) 149/83  Pulse: 65 64  Resp: 20 18  Temp:    SpO2: 99% 99%    Last Pain:  Vitals:   07/07/21 1030  TempSrc:   PainSc: 0-No pain                 Kashus Karlen

## 2021-07-07 NOTE — Brief Op Note (Signed)
07/07/2021  9:25 AM  PATIENT:  [name]  56 y.o. female  PRE-OPERATIVE DIAGNOSIS:  Pelvic Pain  POST-OPERATIVE DIAGNOSIS:  Pelvic Pain  PROCEDURE:  Procedure(s): HYSTERECTOMY VAGINAL WITH SALPINGECTOMY (Bilateral)  SURGEON:  Surgeon(s) and Role:    * Lavonia Drafts, MD - Primary    * Constant, Peggy, MD - Assisting  ANESTHESIA:   general  EBL:  250 mL   BLOOD ADMINISTERED:none  DRAINS: none   LOCAL MEDICATIONS USED:  OTHER dilute vasopressin  SPECIMEN:  Source of Specimen:  uterus and cervix with bilateral fallopian tubes  DISPOSITION OF SPECIMEN:  PATHOLOGY  COUNTS:  YES  TOURNIQUET:  * No tourniquets in log *  DICTATION: .Note written in EPIC  PLAN OF CARE:  prolonged observation   PATIENT DISPOSITION:  PACU - hemodynamically stable.   Delay start of Pharmacological VTE agent (>24hrs) due to surgical blood loss or risk of bleeding: not applicable  Complications: none immediate.  Jamaurie Bernier L. Harraway-Smith, M.D., [facility]

## 2021-07-08 DIAGNOSIS — R102 Pelvic and perineal pain: Secondary | ICD-10-CM | POA: Diagnosis not present

## 2021-07-08 DIAGNOSIS — N72 Inflammatory disease of cervix uteri: Secondary | ICD-10-CM | POA: Diagnosis not present

## 2021-07-08 DIAGNOSIS — I10 Essential (primary) hypertension: Secondary | ICD-10-CM | POA: Diagnosis not present

## 2021-07-08 DIAGNOSIS — N8003 Adenomyosis of the uterus: Secondary | ICD-10-CM | POA: Diagnosis not present

## 2021-07-08 DIAGNOSIS — Z6835 Body mass index (BMI) 35.0-35.9, adult: Secondary | ICD-10-CM | POA: Diagnosis not present

## 2021-07-08 DIAGNOSIS — D649 Anemia, unspecified: Secondary | ICD-10-CM | POA: Diagnosis not present

## 2021-07-08 DIAGNOSIS — D251 Intramural leiomyoma of uterus: Secondary | ICD-10-CM | POA: Diagnosis not present

## 2021-07-08 LAB — SURGICAL PATHOLOGY

## 2021-07-08 MED ORDER — OXYCODONE-ACETAMINOPHEN 5-325 MG PO TABS
ORAL_TABLET | ORAL | Status: AC
  Filled 2021-07-08: qty 1

## 2021-07-08 MED ORDER — KETOROLAC TROMETHAMINE 30 MG/ML IJ SOLN
INTRAMUSCULAR | Status: AC
  Filled 2021-07-08: qty 1

## 2021-07-08 MED ORDER — OXYCODONE-ACETAMINOPHEN 5-325 MG PO TABS
ORAL_TABLET | ORAL | Status: AC
  Filled 2021-07-08: qty 2

## 2021-07-09 ENCOUNTER — Encounter (HOSPITAL_BASED_OUTPATIENT_CLINIC_OR_DEPARTMENT_OTHER): Payer: Self-pay | Admitting: Obstetrics & Gynecology

## 2021-07-22 ENCOUNTER — Ambulatory Visit (INDEPENDENT_AMBULATORY_CARE_PROVIDER_SITE_OTHER): Payer: 59 | Admitting: Obstetrics & Gynecology

## 2021-07-22 ENCOUNTER — Encounter: Payer: Self-pay | Admitting: Obstetrics & Gynecology

## 2021-07-22 ENCOUNTER — Other Ambulatory Visit: Payer: Self-pay

## 2021-07-22 VITALS — BP 126/93 | HR 88

## 2021-07-22 DIAGNOSIS — Z9889 Other specified postprocedural states: Secondary | ICD-10-CM

## 2021-07-22 NOTE — Progress Notes (Signed)
History:  56 y.o.LMP here today for 2 week post op check.Pt is s/p HYSTERECTOMY VAGINAL WITH SALPINGECTOMY (Bilateral) on 07/07/2021.  Pt reports that she is doing well. She is eating and passing stools without difficulty. Some constipation.      The following portions of the patient's history were reviewed and updated as appropriate: allergies, current medications, past family history, past medical history, past social history, past surgical history and problem list.  Review of Systems:  Pertinent items are noted in HPI.    Objective:  Physical Exam BP (!) 126/93    Pulse 88    LMP  (Exact Date)   CONSTITUTIONAL: Well-developed, well-nourished female in no acute distress.  HENT:  Normocephalic, atraumatic EYES: Conjunctivae and EOM are normal. No scleral icterus.  NECK: Normal range of motion SKIN: Skin is warm and dry. No rash noted. Not diaphoretic.No pallor. [place]: Alert and oriented to person, place, and time. Normal coordination.  Abd: Soft, nontender and nondistended; her port sites are healing well. .  Pelvic: deferred  Labs and Imaging Surg path 07/07/2021  FINAL MICROSCOPIC DIAGNOSIS:   A. UTERUS, CERVIX, BILATERAL FALLOPIAN TUBES:  Extensive adenomyosis  Benign leiomyomata, intramural, measuring up to 1.8 cm  Benign proliferative phase endometrium  Chronic cervicitis with squamous metaplasia  Benign fallopian tubes   Assessment & Plan:  2 week post op check following HYSTERECTOMY VAGINAL WITH SALPINGECTOMY   Doing well  Reviewed her surg path.   Reviewed post op instructions and activities  Gradual increase in activities  F/u in 4 weeks or sooner prn  Reviewed no intercourse for 6 weeks post op  All questions answered.   Tushar Enns L. Harraway-Smith, M.D., [facility]

## 2021-07-24 ENCOUNTER — Encounter: Payer: Self-pay | Admitting: General Practice

## 2021-07-28 ENCOUNTER — Ambulatory Visit
Admission: RE | Admit: 2021-07-28 | Discharge: 2021-07-28 | Disposition: A | Discharge status: Home / Self Care | Payer: 59 | Source: Ambulatory Visit | Attending: Obstetrics & Gynecology | Admitting: Obstetrics & Gynecology

## 2021-07-28 DIAGNOSIS — R928 Other abnormal and inconclusive findings on diagnostic imaging of breast: Secondary | ICD-10-CM

## 2021-07-28 DIAGNOSIS — N6002 Solitary cyst of left breast: Secondary | ICD-10-CM | POA: Diagnosis not present

## 2021-07-28 DIAGNOSIS — D242 Benign neoplasm of left breast: Secondary | ICD-10-CM | POA: Diagnosis not present

## 2021-07-28 DIAGNOSIS — N6322 Unspecified lump in the left breast, upper inner quadrant: Secondary | ICD-10-CM | POA: Diagnosis not present

## 2021-07-28 HISTORY — PX: BREAST BIOPSY: SHX20

## 2021-08-03 ENCOUNTER — Encounter: Payer: Self-pay | Admitting: Obstetrics & Gynecology

## 2021-08-12 ENCOUNTER — Other Ambulatory Visit (HOSPITAL_BASED_OUTPATIENT_CLINIC_OR_DEPARTMENT_OTHER): Payer: Self-pay

## 2021-08-12 ENCOUNTER — Encounter: Payer: Self-pay | Admitting: Obstetrics & Gynecology

## 2021-08-12 ENCOUNTER — Ambulatory Visit (INDEPENDENT_AMBULATORY_CARE_PROVIDER_SITE_OTHER): Payer: 59 | Admitting: Obstetrics & Gynecology

## 2021-08-12 ENCOUNTER — Other Ambulatory Visit: Payer: Self-pay

## 2021-08-12 VITALS — BP 167/93 | HR 73 | Ht 66.5 in | Wt 222.0 lb

## 2021-08-12 DIAGNOSIS — N760 Acute vaginitis: Secondary | ICD-10-CM

## 2021-08-12 DIAGNOSIS — Z9889 Other specified postprocedural states: Secondary | ICD-10-CM

## 2021-08-12 DIAGNOSIS — B9689 Other specified bacterial agents as the cause of diseases classified elsewhere: Secondary | ICD-10-CM

## 2021-08-12 MED ORDER — METRONIDAZOLE 500 MG PO TABS
500.0000 mg | ORAL_TABLET | Freq: Two times a day (BID) | ORAL | 0 refills | Status: DC
  Filled 2021-08-12: qty 14, 7d supply, fill #0

## 2021-08-12 NOTE — Progress Notes (Signed)
History:  [name] here today for 6 week post op check.Pt is s/p Vaginal hysterectomy with bilateral salpingectomy on 07/07/2021.  Pt reports that she is doing well. She is eating and passing stools without difficulty.   Large amount of discharge with odor.  ? [name], current medications, past family history, past medical history, past social history, past surgical history and problem list. ? ?Review of Systems:  ?Pertinent items are noted in HPI. ?   ?Objective:  ?Physical Exam ?BP (!) 167/93   Pulse 73   Ht 5' 6.5" (1.689 m)   Wt 222 lb (100.7 kg)   BMI 35.29 kg/m?  ? ?CONSTITUTIONAL: Well-developed, well-nourished female in no acute distress.  ?HENT:  Normocephalic, atraumatic ?EYES: Conjunctivae and EOM are normal. No scleral icterus.  ?NECK: Normal range of motion ?SKIN: Skin is warm and dry. No rash noted. Not diaphoretic.No pallor. [name]: Alert and oriented to person, place, and time. Normal coordination.  ?Abd: Soft, nontender and nondistended; her port sites are healing well. [place] Kitchen  ?Pelvic: no acute changes. Lots of grayish discharge Cuff intact.    ? ?Labs and Imaging ?Surg path 07/07/2021  ?FINAL MICROSCOPIC DIAGNOSIS:  ? ?A. UTERUS, CERVIX, BILATERAL FALLOPIAN TUBES:  ?Extensive adenomyosis  ?Benign leiomyomata, intramural, measuring up to 1.8 cm  ?Benign proliferative phase endometrium  ?Chronic cervicitis with squamous metaplasia  ?Benign fallopian tubes  ? ?Assessment & Plan:  ?6 week post op check following Vaginal hysterectomy with bilateral salpingectomy.  ? Doing well ? Reviewed her surg path.  ? Reviewed post op instructions and activities ? Gradual increase in activities ? F/u in 4 weeks or sooner prn ? Reviewed no intercourse for 8 weeks post op ? All questions answered.  ? [name], M.D., [place]  ?

## 2021-09-03 ENCOUNTER — Encounter: Payer: Self-pay | Admitting: Obstetrics & Gynecology

## 2021-09-21 ENCOUNTER — Other Ambulatory Visit (HOSPITAL_BASED_OUTPATIENT_CLINIC_OR_DEPARTMENT_OTHER): Payer: Self-pay

## 2021-09-21 ENCOUNTER — Ambulatory Visit (INDEPENDENT_AMBULATORY_CARE_PROVIDER_SITE_OTHER): Payer: 59 | Admitting: Obstetrics & Gynecology

## 2021-09-21 ENCOUNTER — Encounter: Payer: Self-pay | Admitting: Obstetrics & Gynecology

## 2021-09-21 ENCOUNTER — Telehealth: Payer: Self-pay | Admitting: Nurse Practitioner

## 2021-09-21 VITALS — BP 150/89 | HR 68 | Wt 223.0 lb

## 2021-09-21 DIAGNOSIS — Z9889 Other specified postprocedural states: Secondary | ICD-10-CM

## 2021-09-21 DIAGNOSIS — I10 Essential (primary) hypertension: Secondary | ICD-10-CM

## 2021-09-21 MED ORDER — LISINOPRIL 5 MG PO TABS
5.0000 mg | ORAL_TABLET | Freq: Every day | ORAL | 1 refills | Status: DC
  Filled 2021-09-21: qty 30, 30d supply, fill #0

## 2021-09-21 NOTE — Patient Instructions (Signed)
Hypertension, Adult High blood pressure (hypertension) is when the force of blood pumping through the arteries is too strong. The arteries are the blood vessels that carry blood from the heart throughout the body. Hypertension forces the heart to work harder to pump blood and may cause arteries to become narrow or stiff. Untreated or uncontrolled hypertension can lead to a heart attack, heart failure, a stroke, kidney disease, and other problems. A blood pressure reading consists of a higher number over a lower number. Ideally, your blood pressure should be below 120/80. The first ("top") number is called the systolic pressure. It is a measure of the pressure in your arteries as your heart beats. The second ("bottom") number is called the diastolic pressure. It is a measure of the pressure in your arteries as the heart relaxes. What are the causes? The exact cause of this condition is not known. There are some conditions that result in high blood pressure. What increases the risk? Certain factors may make you more likely to develop high blood pressure. Some of these risk factors are under your control, including: Smoking. Not getting enough exercise or physical activity. Being overweight. Having too much fat, sugar, calories, or salt (sodium) in your diet. Drinking too much alcohol. Other risk factors include: Having a personal history of heart disease, diabetes, high cholesterol, or kidney disease. Stress. Having a family history of high blood pressure and high cholesterol. Having obstructive sleep apnea. Age. The risk increases with age. What are the signs or symptoms? High blood pressure may not cause symptoms. Very high blood pressure (hypertensive crisis) may cause: Headache. Fast or irregular heartbeats (palpitations). Shortness of breath. Nosebleed. Nausea and vomiting. Vision changes. Severe chest pain, dizziness, and seizures. How is this diagnosed? This condition is diagnosed by  measuring your blood pressure while you are seated, with your arm resting on a flat surface, your legs uncrossed, and your feet flat on the floor. The cuff of the blood pressure monitor will be placed directly against the skin of your upper arm at the level of your heart. Blood pressure should be measured at least twice using the same arm. Certain conditions can cause a difference in blood pressure between your right and left arms. If you have a high blood pressure reading during one visit or you have normal blood pressure with other risk factors, you may be asked to: Return on a different day to have your blood pressure checked again. Monitor your blood pressure at home for 1 week or longer. If you are diagnosed with hypertension, you may have other blood or imaging tests to help your health care provider understand your overall risk for other conditions. How is this treated? This condition is treated by making healthy lifestyle changes, such as eating healthy foods, exercising more, and reducing your alcohol intake. You may be referred for counseling on a healthy diet and physical activity. Your health care provider may prescribe medicine if lifestyle changes are not enough to get your blood pressure under control and if: Your systolic blood pressure is above 130. Your diastolic blood pressure is above 80. Your personal target blood pressure may vary depending on your medical conditions, your age, and other factors. Follow these instructions at home: Eating and drinking  Eat a diet that is high in fiber and potassium, and low in sodium, added sugar, and fat. An example of this eating plan is called the DASH diet. DASH stands for Dietary Approaches to Stop Hypertension. To eat this way: Eat   plenty of fresh fruits and vegetables. Try to fill one half of your plate at each meal with fruits and vegetables. Eat whole grains, such as whole-wheat pasta, brown rice, or whole-grain bread. Fill about one  fourth of your plate with whole grains. Eat or drink low-fat dairy products, such as skim milk or low-fat yogurt. Avoid fatty cuts of meat, processed or cured meats, and poultry with skin. Fill about one fourth of your plate with lean proteins, such as fish, chicken without skin, beans, eggs, or tofu. Avoid pre-made and processed foods. These tend to be higher in sodium, added sugar, and fat. Reduce your daily sodium intake. Many people with hypertension should eat less than 1,500 mg of sodium a day. Do not drink alcohol if: Your health care provider tells you not to drink. You are pregnant, may be pregnant, or are planning to become pregnant. If you drink alcohol: Limit how much you have to: 0-1 drink a day for women. 0-2 drinks a day for men. Know how much alcohol is in your drink. In the [place] one drink equals one 12 oz bottle of beer (355 mL), one 5 oz glass of wine (148 mL), or one 1 oz glass of hard liquor (44 mL). Lifestyle  Work with your health care provider to maintain a healthy body weight or to lose weight. Ask what an ideal weight is for you. Get at least 30 minutes of exercise that causes your heart to beat faster (aerobic exercise) most days of the week. Activities may include walking, swimming, or biking. Include exercise to strengthen your muscles (resistance exercise), such as Pilates or lifting weights, as part of your weekly exercise routine. Try to do these types of exercises for 30 minutes at least 3 days a week. Do not use any products that contain nicotine or tobacco. These products include cigarettes, chewing tobacco, and vaping devices, such as e-cigarettes. If you need help quitting, ask your health care provider. Monitor your blood pressure at home as told by your health care provider. Keep all follow-up visits. This is important. Medicines Take over-the-counter and prescription medicines only as told by your health care provider. Follow directions carefully. Blood  pressure medicines must be taken as prescribed. Do not skip doses of blood pressure medicine. Doing this puts you at risk for problems and can make the medicine less effective. Ask your health care provider about side effects or reactions to medicines that you should watch for. Contact a health care provider if you: Think you are having a reaction to a medicine you are taking. Have headaches that keep coming back (recurring). Feel dizzy. Have swelling in your ankles. Have trouble with your vision. Get help right away if you: Develop a severe headache or confusion. Have unusual weakness or numbness. Feel faint. Have severe pain in your chest or abdomen. Vomit repeatedly. Have trouble breathing. These symptoms may be an emergency. Get help right away. Call 911. Do not wait to see if the symptoms will go away. Do not drive yourself to the hospital. Summary Hypertension is when the force of blood pumping through your arteries is too strong. If this condition is not controlled, it may put you at risk for serious complications. Your personal target blood pressure may vary depending on your medical conditions, your age, and other factors. For most people, a normal blood pressure is less than 120/80. Hypertension is treated with lifestyle changes, medicines, or a combination of both. Lifestyle changes include losing weight, eating a healthy,   low-sodium diet, exercising more, and limiting alcohol. This information is not intended to replace advice given to you by your health care provider. Make sure you discuss any questions you have with your health care provider. Document Revised: 03/24/2021 Document Reviewed: 03/24/2021 Elsevier Patient Education  2023 [employer] Inc.  

## 2021-09-21 NOTE — Progress Notes (Signed)
History:  [name] here today for 11 week post op check.Pt is s/p TVH bilateral salpingectomy on 07/07/2021.  Pt reports that she is doing well. Pt denies further discharge. No bleedign or odor. She is back at work without issues. Her BP is elevated today. She is on no meds.  ? [name], current medications, past family history, past medical history, past social history, past surgical history and problem list. ? ?Review of Systems:  ?Pertinent items are noted in HPI. ?   ?Objective:  ?Physical Exam ? ?BP (!) 150/89   Pulse 68   Wt 223 lb (101.2 kg)   LMP 05/04/2018   BMI 35.45 kg/m?  ? ?CONSTITUTIONAL: Well-developed, well-nourished female in no acute distress.  ?HENT:  Normocephalic, atraumatic ?EYES: Conjunctivae and EOM are normal. No scleral icterus.  ?NECK: Normal range of motion ?SKIN: Skin is warm and dry. No rash noted. Not diaphoretic.No pallor. ?[place]: Alert and oriented to person, place, and time. Normal coordination.  ?Abd: Soft, nontender and nondistended; her port sites are healing well. .  ? ? ?Assessment & Plan:  ?11 week post op check following TVH with bilateral salpingectomy. All sx have resolved. Her BP is elevated today. Pts BP has been elevated for the past year in review of her VS.  ?  ? Doing well ? F/u in 3 months or sooner prn ? May return to full activity ? All questions answered.  ? ?Shanna was seen today for follow-up. ? ?Diagnoses and all orders for this visit: ? ?Post-operative state ? ?Essential (primary) hypertension ?-     lisinopril (ZESTRIL) 5 MG tablet; Take 1 tablet (5 mg total) by mouth daily. ? ?Sent note to pts primary care provider.  ?Pt to make appt to be seen.  ? ?Mohamud Mrozek L. Harraway-Smith, M.D., [place]  ?

## 2021-09-21 NOTE — Telephone Encounter (Signed)
I let her know you wanted me to scheduled a HTN f/up for her, she scheduled a cpe in June. ?

## 2021-11-17 ENCOUNTER — Ambulatory Visit (INDEPENDENT_AMBULATORY_CARE_PROVIDER_SITE_OTHER): Payer: 59 | Admitting: Nurse Practitioner

## 2021-11-17 ENCOUNTER — Other Ambulatory Visit (HOSPITAL_BASED_OUTPATIENT_CLINIC_OR_DEPARTMENT_OTHER): Payer: Self-pay

## 2021-11-17 ENCOUNTER — Encounter: Payer: Self-pay | Admitting: Nurse Practitioner

## 2021-11-17 VITALS — BP 150/92 | HR 70 | Temp 97.6°F | Ht 66.5 in | Wt 223.8 lb

## 2021-11-17 DIAGNOSIS — D500 Iron deficiency anemia secondary to blood loss (chronic): Secondary | ICD-10-CM | POA: Diagnosis not present

## 2021-11-17 DIAGNOSIS — Z23 Encounter for immunization: Secondary | ICD-10-CM

## 2021-11-17 DIAGNOSIS — Z0000 Encounter for general adult medical examination without abnormal findings: Secondary | ICD-10-CM

## 2021-11-17 DIAGNOSIS — Z13220 Encounter for screening for lipoid disorders: Secondary | ICD-10-CM

## 2021-11-17 DIAGNOSIS — Z136 Encounter for screening for cardiovascular disorders: Secondary | ICD-10-CM | POA: Diagnosis not present

## 2021-11-17 DIAGNOSIS — I10 Essential (primary) hypertension: Secondary | ICD-10-CM | POA: Insufficient documentation

## 2021-11-17 DIAGNOSIS — E010 Iodine-deficiency related diffuse (endemic) goiter: Secondary | ICD-10-CM | POA: Diagnosis not present

## 2021-11-17 MED ORDER — LISINOPRIL 10 MG PO TABS
10.0000 mg | ORAL_TABLET | Freq: Every day | ORAL | 5 refills | Status: DC
  Filled 2021-11-17: qty 90, 90d supply, fill #0

## 2021-11-17 NOTE — Assessment & Plan Note (Signed)
Repeat TSH

## 2021-11-17 NOTE — Progress Notes (Signed)
Complete physical exam  Patient: Carmen Porter   DOB: 23-Jun-1965   56 y.o. Female  MRN: [id number] Visit Date: 11/17/2021  Subjective:    Chief Complaint  Patient presents with   Annual Exam    CPE Pt not fasting  Tdap & shingles given today  No concerns Pt takes BP meds in the mornings,  but skipped a dose today   Carmen Porter is a 56 y.o. female who presents today for a complete physical exam. She reports consuming a general diet.  none  She generally feels fairly well. She reports sleeping poorly. She does have additional problems to discuss today.  Vision:No Dental:No STD Screen:No   Most recent fall risk assessment:    02/15/2018    9:23 AM  [place] in the past year? No   Most recent depression screenings:    11/17/2021    2:14 PM 10/30/2020    8:07 AM  PHQ 2/9 Scores  PHQ - 2 Score 0 0  PHQ- 9 Score 2     HPI  Routine general medical examination at a health care facility Advised about importance of adequate sleep, heart healthy diet and exercise. 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  Advised about possible complications of uncontrolled HTN, the importance of DASH diet, adequate sleep, and medication compliance. Increase lisinopril to '10mg'$  Use melatonin 5-'10mg'$  at bedtime for sleep F/up in 3weeks   Past Medical History:  Diagnosis Date   Anemia 2019   hx of anemia due to very heavy periods, pt received iron infusions, pt had an endometrial  ablation in 2020   Arthritis    right knee   Blood transfusion without reported diagnosis 2012   due to heavy menstrual periods   Complication of anesthesia    Complication from anesthesia was already marked as positive in Epic. Per 06/26/21 phone call with pt, the only problem with anesthesia that she has ever had was being slow to wake up from her colonscopy in 2019   Past Surgical History:  Procedure Laterality Date   BREAST BIOPSY Left 07/28/2021   COLONOSCOPY W/ POLYPECTOMY  04/17/2018   3 polyps removed   DILITATION & CURRETTAGE/HYSTROSCOPY WITH NOVASURE ABLATION N/A 11/07/2018   Procedure: DILATATION & CURETTAGE/HYSTEROSCOPY WITH NOVASURE ABLATION;  Surgeon: HLavonia Drafts MD;  Location: [facility]  Service: Gynecology;  Laterality: N/A;   TUBAL LIGATION     VAGINAL HYSTERECTOMY Bilateral 07/07/2021   Procedure: HYSTERECTOMY VAGINAL WITH SALPINGECTOMY;  Surgeon: HLavonia Drafts MD;  [facility]  Service: Gynecology;  Laterality: Bilateral;   Social History   Socioeconomic History   Marital status: Married    Spouse name: Not on file   Number of children: 4   Years of education: Not on file   Highest education level: Not on file  Occupational History   Not on file  Tobacco Use   Smoking status: Never   Smokeless tobacco: Never  Vaping Use   Vaping Use: Never  used  Substance and Sexual Activity   Alcohol use: Yes    Comment: social   Drug use: No   Sexual activity: Not Currently  Other Topics Concern   Not on file  Social History Narrative   Not on file   Social Determinants of Health   Financial Resource Strain: Not on file  Food Insecurity: Not on file  Transportation Needs: Not on file  Physical Activity: Not on file  Stress: Not on file  Social Connections: Not on file  Intimate Partner Violence: Not on file   Family Status  Relation Name Status   Father  (Not Specified)   MGM  (Not Specified)   MGF  (Not  Specified)   Mat Aunt  (Not Specified)   Neg Hx  (Not Specified)   Family History  Problem Relation Age of Onset   Stroke Father    Hypertension Father    Heart failure Father    Heart disease Father    Cancer Maternal Grandmother        cervical cancer   Cancer Maternal Grandfather        prostate cancer   Glaucoma Maternal Grandfather    Breast cancer Maternal Aunt    Colon cancer Neg Hx    No Known Allergies  Patient Care Team: Jillene Wehrenberg, Charlene Brooke, NP as PCP - General (Internal Medicine) Teonna Coonan, Charlene Brooke, NP as Nurse Practitioner (Internal Medicine)   Medications: Outpatient Medications Prior to Visit  Medication Sig   Multiple Vitamin (MULTIVITAMIN) capsule Take 1 capsule by mouth daily.   [DISCONTINUED] lisinopril (ZESTRIL) 5 MG tablet Take 1 tablet (5 mg total) by mouth daily.   [DISCONTINUED] HYDROcodone-acetaminophen (NORCO/VICODIN) 5-325 MG tablet Take 1 tablet by mouth every 6 (six) hours as needed for moderate pain. (Patient not taking: Reported on 07/22/2021)   [DISCONTINUED] ibuprofen (ADVIL) 800 MG tablet Take 1 tablet (800 mg total) by mouth every 8 (eight) hours as needed. (Patient not taking: Reported on 07/22/2021)   [DISCONTINUED] metroNIDAZOLE (FLAGYL) 500 MG tablet Take 1 tablet (500 mg total) by mouth 2 (two) times daily. (Patient not taking: Reported on 09/21/2021)   No facility-administered medications prior to visit.    Review of Systems  Constitutional:  Negative for fever.  HENT:  Negative for congestion and sore throat.   Eyes:        Negative for visual changes  Respiratory:  Negative for cough and shortness of breath.   Cardiovascular:  Negative for chest pain, palpitations and leg swelling.  Gastrointestinal:  Negative for blood in stool, constipation and diarrhea.  Genitourinary:  Negative for dysuria, frequency and urgency.  Musculoskeletal:  Negative for myalgias.  Skin:  Negative for rash.  Neurological:  Negative for dizziness and  headaches.  Hematological:  Does not bruise/bleed easily.  Psychiatric/Behavioral:  Negative for suicidal ideas. The patient is not nervous/anxious.     Last CBC Lab Results  Component Value Date   WBC 4.2 07/07/2021   HGB 11.4 (L) 07/07/2021   HCT 36.1 07/07/2021   MCV 87.0 07/07/2021   MCH 26.8 07/07/2021   RDW 13.5 07/07/2021   PLT 205 62/22/9798   Last metabolic panel Lab Results  Component Value Date   GLUCOSE 111 (H) 11/06/2018   NA 141 11/06/2018   K 3.5 11/06/2018   CL 107 11/06/2018   CO2 22 11/06/2018   BUN 10 11/06/2018   CREATININE 0.68 11/06/2018   GFRNONAA >60 11/06/2018   CALCIUM 9.8 11/06/2018   PROT 7.8  03/13/2018   ALBUMIN 4.1 03/13/2018   BILITOT 0.4 03/13/2018   ALKPHOS 84 03/13/2018   AST 24 03/13/2018   ALT 19 03/13/2018   ANIONGAP 12 11/06/2018        Objective:  BP (!) 150/92 (BP Location: Right Arm, Patient Position: Sitting, Cuff Size: Large)   Pulse 70   Temp 97.6 F (36.4 C) (Temporal)   Ht 5' 6.5" (1.689 m)   Wt 223 lb 12.8 oz (101.5 kg)   LMP 05/04/2018   SpO2 97%   BMI 35.58 kg/m     BP Readings from Last 3 Encounters:  11/17/21 (!) 150/92  09/21/21 (!) 150/89  08/12/21 (!) 167/93   Wt Readings from Last 3 Encounters:  11/17/21 223 lb 12.8 oz (101.5 kg)  09/21/21 223 lb (101.2 kg)  08/12/21 222 lb (100.7 kg)    Physical Exam Constitutional:      General: She is not in acute distress. HENT:     Right Ear: Tympanic membrane, ear canal and external ear normal.     Left Ear: Tympanic membrane, ear canal and external ear normal.     Nose: Nose normal.     Mouth/Throat:     Pharynx: No oropharyngeal exudate.  Eyes:     General: No scleral icterus.    Extraocular Movements: Extraocular movements intact.     Conjunctiva/sclera: Conjunctivae normal.  Cardiovascular:     Rate and Rhythm: Normal rate and regular rhythm.     Pulses: Normal pulses.     Heart sounds: Normal heart sounds.  Pulmonary:     Effort: Pulmonary  effort is normal. No respiratory distress.     Breath sounds: Normal breath sounds.  Abdominal:     General: There is no distension.     Palpations: Abdomen is soft.  Musculoskeletal:        General: Normal range of motion.     Cervical back: Normal range of motion and neck supple.     Right lower leg: No edema.     Left lower leg: No edema.  Lymphadenopathy:     Cervical: No cervical adenopathy.  Skin:    General: Skin is warm and dry.  Neurological:     Mental Status: She is alert and oriented to person, place, and time.  Psychiatric:        Mood and Affect: Mood normal.        Behavior: Behavior normal.        Thought Content: Thought content normal.     No results found for any visits on 11/17/21.    Assessment & Plan:    Routine Health Maintenance and Physical Exam  Immunization History  Administered Date(s) Administered   PFIZER(Purple Top)SARS-COV-2 Vaccination 06/02/2019, 07/03/2019, 06/27/2020   PPD Test 10/24/2014, 07/17/2017   Tdap 11/17/2021   Zoster Recombinat (Shingrix) 11/17/2021    Health Maintenance  Topic Date Due   Hepatitis C Screening  Never done   COLONOSCOPY (Pts 45-47yr Insurance coverage will need to be confirmed)  04/17/2021   COVID-19 Vaccine (4 - Pfizer series) 12/03/2021 (Originally 08/22/2020)   INFLUENZA VACCINE  12/29/2021   Zoster Vaccines- Shingrix (2 of 2) 01/12/2022   MAMMOGRAM  05/20/2023   PAP SMEAR-Modifier  05/18/2024   TETANUS/TDAP  11/18/2031   HIV Screening  Completed   HPV VACCINES  Aged Out    Discussed health benefits of physical activity, and encouraged her to engage in regular exercise appropriate for her age and condition.  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  Advised about possible complications of uncontrolled HTN, the importance of DASH diet, adequate sleep, and medication compliance. Increase lisinopril to '10mg'$  Use melatonin 5-'10mg'$  at bedtime for sleep F/up in 3weeks      Relevant Medications   lisinopril (ZESTRIL) 10 MG tablet     Endocrine   Thyromegaly    Repeat TSH      Relevant Orders   TSH     Other   Iron deficiency anemia due to chronic blood loss    S/p hysterectomy Needs repeat colonoscopy due to hx of colon polyps  Repeat cbc and iron panel      Relevant Orders   CBC   Iron, TIBC and Ferritin Panel   Routine general medical examination at a health care facility - Primary    Advised about importance of adequate sleep, heart healthy diet and exercise. Schedule appt with GI for colonoscopy, appt with dentists and opthalmology      Relevant Orders   Comprehensive metabolic panel   Lipid panel   Other Visit Diagnoses     Encounter for lipid screening for cardiovascular disease       Relevant Orders   Lipid panel   Need for shingles vaccine       Relevant Orders   Tdap vaccine greater than or equal to 7yo IM (Completed)   Varicella-zoster vaccine IM (Shingrix) (Completed)   Need for Tdap vaccination       Relevant Orders   Tdap vaccine greater than or equal to 7yo IM (Completed)      Return in about 20 days (around 12/07/2021) for HTN.     CWilfred Lacy NP

## 2021-11-17 NOTE — Assessment & Plan Note (Signed)
S/p hysterectomy Needs repeat colonoscopy due to hx of colon polyps  Repeat cbc and iron panel

## 2021-11-17 NOTE — Assessment & Plan Note (Signed)
Advised about importance of adequate sleep, heart healthy diet and exercise. Schedule appt with GI for colonoscopy, appt with dentists and opthalmology

## 2021-11-17 NOTE — Assessment & Plan Note (Signed)
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  Advised about possible complications of uncontrolled HTN, the importance of DASH diet, adequate sleep, and medication compliance. Increase lisinopril to '10mg'$  Use melatonin 5-'10mg'$  at bedtime for sleep F/up in 3weeks

## 2021-11-17 NOTE — Patient Instructions (Addendum)
Increase lisinopril to '10mg'$  daily Maintain DASH diet Maintain adequate oral hydration with water and sleep Use melatonin 5-'10mg'$  at bedtime. Schedule appt with GI for repeat colonoscopy  Schedule lab appt. Need to be fasting 8hrs prior to blood draw  DASH Eating Plan DASH stands for Dietary Approaches to Stop Hypertension. The DASH eating plan is a healthy eating plan that has been shown to: Reduce high blood pressure (hypertension). Reduce your risk for type 2 diabetes, heart disease, and stroke. Help with weight loss. What are tips for following this plan? Reading food labels Check food labels for the amount of salt (sodium) per serving. Choose foods with less than 5 percent of the Daily Value of sodium. Generally, foods with less than 300 milligrams (mg) of sodium per serving fit into this eating plan. To find whole grains, look for the word "whole" as the first word in the ingredient list. Shopping Buy products labeled as "low-sodium" or "no salt added." Buy fresh foods. Avoid canned foods and pre-made or frozen meals. Cooking Avoid adding salt when cooking. Use salt-free seasonings or herbs instead of table salt or sea salt. Check with your health care provider or pharmacist before using salt substitutes. Do not fry foods. Cook foods using healthy methods such as baking, boiling, grilling, roasting, and broiling instead. Cook with heart-healthy oils, such as olive, canola, avocado, soybean, or sunflower oil. Meal planning  Eat a balanced diet that includes: 4 or more servings of fruits and 4 or more servings of vegetables each day. Try to fill one-half of your plate with fruits and vegetables. 6-8 servings of whole grains each day. Less than 6 oz (170 g) of lean meat, poultry, or fish each day. A 3-oz (85-g) serving of meat is about the same size as a deck of cards. One egg equals 1 oz (28 g). 2-3 servings of low-fat dairy each day. One serving is 1 cup (237 mL). 1 serving of nuts,  seeds, or beans 5 times each week. 2-3 servings of heart-healthy fats. Healthy fats called omega-3 fatty acids are found in foods such as walnuts, flaxseeds, fortified milks, and eggs. These fats are also found in cold-water fish, such as sardines, salmon, and mackerel. Limit how much you eat of: Canned or prepackaged foods. Food that is high in trans fat, such as some fried foods. Food that is high in saturated fat, such as fatty meat. Desserts and other sweets, sugary drinks, and other foods with added sugar. Full-fat dairy products. Do not salt foods before eating. Do not eat more than 4 egg yolks a week. Try to eat at least 2 vegetarian meals a week. Eat more home-cooked food and less restaurant, buffet, and fast food. Lifestyle When eating at a restaurant, ask that your food be prepared with less salt or no salt, if possible. If you drink alcohol: Limit how much you use to: 0-1 drink a day for women who are not pregnant. 0-2 drinks a day for men. Be aware of how much alcohol is in your drink. In the [place] one drink equals one 12 oz bottle of beer (355 mL), one 5 oz glass of wine (148 mL), or one 1 oz glass of hard liquor (44 mL). General information Avoid eating more than 2,300 mg of salt a day. If you have hypertension, you may need to reduce your sodium intake to 1,500 mg a day. Work with your health care provider to maintain a healthy body weight or to lose weight. Ask what an  ideal weight is for you. Get at least 30 minutes of exercise that causes your heart to beat faster (aerobic exercise) most days of the week. Activities may include walking, swimming, or biking. Work with your health care provider or dietitian to adjust your eating plan to your individual calorie needs. What foods should I eat? Fruits All fresh, dried, or frozen fruit. Canned fruit in natural juice (without added sugar). Vegetables Fresh or frozen vegetables (raw, steamed, roasted, or grilled). Low-sodium or  reduced-sodium tomato and vegetable juice. Low-sodium or reduced-sodium tomato sauce and tomato paste. Low-sodium or reduced-sodium canned vegetables. Grains Whole-grain or whole-wheat bread. Whole-grain or whole-wheat pasta. Brown rice. [place]. Bulgur. Whole-grain and low-sodium cereals. Pita bread. Low-fat, low-sodium crackers. Whole-wheat flour tortillas. Meats and other proteins Skinless chicken or [place]. Ground chicken or [place]. Pork with fat trimmed off. Fish and seafood. Egg whites. Dried beans, peas, or lentils. Unsalted nuts, nut butters, and seeds. Unsalted canned beans. Lean cuts of beef with fat trimmed off. Low-sodium, lean precooked or cured meat, such as sausages or meat loaves. Dairy Low-fat (1%) or fat-free (skim) milk. Reduced-fat, low-fat, or fat-free cheeses. Nonfat, low-sodium ricotta or cottage cheese. Low-fat or nonfat yogurt. Low-fat, low-sodium cheese. Fats and oils Soft margarine without trans fats. Vegetable oil. Reduced-fat, low-fat, or light mayonnaise and salad dressings (reduced-sodium). Canola, safflower, olive, avocado, soybean, and sunflower oils. Avocado. Seasonings and condiments Herbs. Spices. Seasoning mixes without salt. Other foods Unsalted popcorn and pretzels. Fat-free sweets. The items listed above may not be a complete list of foods and beverages you can eat. Contact a dietitian for more information. What foods should I avoid? Fruits Canned fruit in a light or heavy syrup. Fried fruit. Fruit in cream or butter sauce. Vegetables Creamed or fried vegetables. Vegetables in a cheese sauce. Regular canned vegetables (not low-sodium or reduced-sodium). Regular canned tomato sauce and paste (not low-sodium or reduced-sodium). Regular tomato and vegetable juice (not low-sodium or reduced-sodium). Angie Fava. Olives. Grains Baked goods made with fat, such as croissants, muffins, or some breads. Dry pasta or rice meal packs. Meats and other  proteins Fatty cuts of meat. Ribs. Fried meat. Berniece Salines. Bologna, salami, and other precooked or cured meats, such as sausages or meat loaves. Fat from the back of a pig (fatback). Bratwurst. Salted nuts and seeds. Canned beans with added salt. Canned or smoked fish. Whole eggs or egg yolks. Chicken or [place] with skin. Dairy Whole or 2% milk, cream, and half-and-half. Whole or full-fat cream cheese. Whole-fat or sweetened yogurt. Full-fat cheese. Nondairy creamers. Whipped toppings. Processed cheese and cheese spreads. Fats and oils Butter. Stick margarine. Lard. Shortening. Ghee. Bacon fat. Tropical oils, such as coconut, palm kernel, or palm oil. Seasonings and condiments Onion salt, garlic salt, seasoned salt, table salt, and sea salt. Worcestershire sauce. Tartar sauce. Barbecue sauce. Teriyaki sauce. Soy sauce, including reduced-sodium. Steak sauce. Canned and packaged gravies. Fish sauce. Oyster sauce. Cocktail sauce. Store-bought horseradish. Ketchup. Mustard. Meat flavorings and tenderizers. Bouillon cubes. Hot sauces. Pre-made or packaged marinades. Pre-made or packaged taco seasonings. Relishes. Regular salad dressings. Other foods Salted popcorn and pretzels. The items listed above may not be a complete list of foods and beverages you should avoid. Contact a dietitian for more information. Where to find more information National Heart, Lung, and Blood Institute: https://wilson-eaton.com/ American Heart Association: www.heart.org Academy of Nutrition and Dietetics: www.eatright.[place]: www.kidney.org Summary The DASH eating plan is a healthy eating plan that has been shown to reduce high blood  pressure (hypertension). It may also reduce your risk for type 2 diabetes, heart disease, and stroke. When on the DASH eating plan, aim to eat more fresh fruits and vegetables, whole grains, lean proteins, low-fat dairy, and heart-healthy fats. With the DASH eating plan, you should  limit salt (sodium) intake to 2,300 mg a day. If you have hypertension, you may need to reduce your sodium intake to 1,500 mg a day. Work with your health care provider or dietitian to adjust your eating plan to your individual calorie needs. This information is not intended to replace advice given to you by your health care provider. Make sure you discuss any questions you have with your health care provider. Document Revised: 04/20/2019 Document Reviewed: 04/20/2019 Elsevier Patient Education  [place].

## 2021-12-09 ENCOUNTER — Encounter: Payer: Self-pay | Admitting: Nurse Practitioner

## 2021-12-09 ENCOUNTER — Telehealth: Payer: Self-pay

## 2021-12-09 ENCOUNTER — Other Ambulatory Visit (HOSPITAL_BASED_OUTPATIENT_CLINIC_OR_DEPARTMENT_OTHER): Payer: Self-pay

## 2021-12-09 ENCOUNTER — Ambulatory Visit: Payer: 59 | Admitting: Nurse Practitioner

## 2021-12-09 VITALS — BP 126/80 | HR 66 | Temp 97.5°F | Ht 66.5 in | Wt 222.4 lb

## 2021-12-09 DIAGNOSIS — I10 Essential (primary) hypertension: Secondary | ICD-10-CM | POA: Diagnosis not present

## 2021-12-09 DIAGNOSIS — R739 Hyperglycemia, unspecified: Secondary | ICD-10-CM | POA: Diagnosis not present

## 2021-12-09 LAB — HEMOGLOBIN A1C: Hgb A1c MFr Bld: 6.5 % (ref 4.6–6.5)

## 2021-12-09 MED ORDER — LISINOPRIL 10 MG PO TABS
10.0000 mg | ORAL_TABLET | Freq: Every day | ORAL | 3 refills | Status: AC
  Filled 2021-12-09 – 2022-03-23 (×2): qty 90, 90d supply, fill #0

## 2021-12-09 NOTE — Progress Notes (Signed)
                Established Patient Visit  Patient: Carmen Porter   DOB: [birth year]   56 y.o. Female  MRN: [id number] Visit Date: 12/09/2021  Subjective:    Chief Complaint  Patient presents with   Office Visit    HTN F/u Checks BP daily Ranges around 140/86 No concerns today    HPI Essential (primary) hypertension Improved and at goal with lisinopril BP Readings from Last 3 Encounters:  12/09/21 126/80  11/17/21 (!) 150/92  09/21/21 (!) 150/89   Maintain med dose and DASH diet F/up in 15month  Reviewed medical, surgical, and social history today  Medications: Outpatient Medications Prior to Visit  Medication Sig   Multiple Vitamin (MULTIVITAMIN) capsule Take 1 capsule by mouth daily.   [DISCONTINUED] lisinopril (ZESTRIL) 10 MG tablet Take 1 tablet (10 mg total) by mouth daily.   No facility-administered medications prior to visit.   Reviewed past medical and social history.   ROS per HPI above      Objective:  BP 126/80 (BP Location: Left Arm, Patient Position: Sitting, Cuff Size: Normal)   Pulse 66   Temp (!) 97.5 F (36.4 C) (Temporal)   Ht 5' 6.5" (1.689 m)   Wt 222 lb 6.4 oz (100.9 kg)   LMP 05/04/2018   SpO2 99%   BMI 35.36 kg/m      Physical Exam Cardiovascular:     Rate and Rhythm: Normal rate and regular rhythm.     Pulses: Normal pulses.     Heart sounds: Normal heart sounds.  Pulmonary:     Effort: Pulmonary effort is normal.     Breath sounds: Normal breath sounds.  Musculoskeletal:     Right lower leg: No edema.     Left lower leg: No edema.  Neurological:     Mental Status: She is alert and oriented to person, place, and time.     No results found for any visits on 12/09/21.    Assessment & Plan:    Problem List Items Addressed This Visit       Cardiovascular and Mediastinum   Essential (primary) hypertension - Primary    Improved and at goal with lisinopril BP Readings from Last 3 Encounters:  12/09/21 126/80  11/17/21  (!) 150/92  09/21/21 (!) 150/89   Maintain med dose and DASH diet F/up in 327month     Relevant Medications   lisinopril (ZESTRIL) 10 MG tablet   Other Visit Diagnoses     Hyperglycemia       Relevant Orders   Hemoglobin A1c      Return in about 3 months (around 03/11/2022) for HTN.     ChWilfred LacyNP

## 2021-12-09 NOTE — Telephone Encounter (Signed)
Pt was supposed to have labs drawn today as well at her appt, only the A1C was drawn. I called pt and left her a VM to see if she could come back in to have these drawn. If pt calls back , she is to be put on the schedule for a Lab visit to have future labs drawn.

## 2021-12-09 NOTE — Assessment & Plan Note (Signed)
Improved and at goal with lisinopril BP Readings from Last 3 Encounters:  12/09/21 126/80  11/17/21 (!) 150/92  09/21/21 (!) 150/89   Maintain med dose and DASH diet F/up in 80month

## 2021-12-09 NOTE — Patient Instructions (Addendum)
Maintain med dose and DASH diet Start daily exercise (walking 13mns, and light weight training or strength training 276ms per day). Monitor Home BP with upper arm cuff Send BP reading on Thursday of Friday.  Go to lab  Schedule appt with GI for repeat colonoscopy

## 2021-12-10 ENCOUNTER — Other Ambulatory Visit (INDEPENDENT_AMBULATORY_CARE_PROVIDER_SITE_OTHER): Payer: 59

## 2021-12-10 DIAGNOSIS — Z13220 Encounter for screening for lipoid disorders: Secondary | ICD-10-CM

## 2021-12-10 DIAGNOSIS — Z136 Encounter for screening for cardiovascular disorders: Secondary | ICD-10-CM

## 2021-12-10 DIAGNOSIS — E010 Iodine-deficiency related diffuse (endemic) goiter: Secondary | ICD-10-CM

## 2021-12-10 DIAGNOSIS — D500 Iron deficiency anemia secondary to blood loss (chronic): Secondary | ICD-10-CM

## 2021-12-10 DIAGNOSIS — Z0000 Encounter for general adult medical examination without abnormal findings: Secondary | ICD-10-CM | POA: Diagnosis not present

## 2021-12-10 LAB — CBC
HCT: 36.2 % (ref 36.0–46.0)
Hemoglobin: 11.7 g/dL — ABNORMAL LOW (ref 12.0–15.0)
MCHC: 32.3 g/dL (ref 30.0–36.0)
MCV: 80.7 fl (ref 78.0–100.0)
Platelets: 199 10*3/uL (ref 150.0–400.0)
RBC: 4.48 Mil/uL (ref 3.87–5.11)
RDW: 14.4 % (ref 11.5–15.5)
WBC: 3.5 10*3/uL — ABNORMAL LOW (ref 4.0–10.5)

## 2021-12-10 LAB — COMPREHENSIVE METABOLIC PANEL
ALT: 13 U/L (ref 0–35)
AST: 17 U/L (ref 0–37)
Albumin: 4.2 g/dL (ref 3.5–5.2)
Alkaline Phosphatase: 71 U/L (ref 39–117)
BUN: 14 mg/dL (ref 6–23)
CO2: 26 mEq/L (ref 19–32)
Calcium: 9.7 mg/dL (ref 8.4–10.5)
Chloride: 106 mEq/L (ref 96–112)
Creatinine, Ser: 0.6 mg/dL (ref 0.40–1.20)
GFR: 101.76 mL/min (ref >60.00)
Glucose, Bld: 102 mg/dL — ABNORMAL HIGH (ref 70–99)
Potassium: 4.2 mEq/L (ref 3.5–5.1)
Sodium: 140 mEq/L (ref 135–145)
Total Bilirubin: 0.4 mg/dL (ref 0.2–1.2)
Total Protein: 7.3 g/dL (ref 6.0–8.3)

## 2021-12-10 LAB — LIPID PANEL
Cholesterol: 179 mg/dL (ref 0–200)
HDL: 53 mg/dL (ref >39.00)
LDL Cholesterol: 115 mg/dL — ABNORMAL HIGH (ref 0–99)
NonHDL: 126.24
Total CHOL/HDL Ratio: 3
Triglycerides: 54 mg/dL (ref 0.0–149.0)
VLDL: 10.8 mg/dL (ref 0.0–40.0)

## 2021-12-10 LAB — TSH: TSH: 1.29 u[IU]/mL (ref 0.35–5.50)

## 2021-12-11 ENCOUNTER — Telehealth: Payer: Self-pay | Admitting: Nurse Practitioner

## 2021-12-11 DIAGNOSIS — E1165 Type 2 diabetes mellitus with hyperglycemia: Secondary | ICD-10-CM

## 2021-12-11 DIAGNOSIS — Z1211 Encounter for screening for malignant neoplasm of colon: Secondary | ICD-10-CM

## 2021-12-11 LAB — IRON,TIBC AND FERRITIN PANEL
%SAT: 27 % (calc) (ref 16–45)
Ferritin: 40 ng/mL (ref 16–232)
Iron: 74 ug/dL (ref 45–160)
TIBC: 273 mcg/dL (calc) (ref 250–450)

## 2021-12-11 NOTE — Telephone Encounter (Signed)
-----   Message from [place] sent at 12/11/2021  9:47 AM EDT ----- Pt advised & voiced understanding. Is okay w/ referral. Pt would like another referral to GI for her colonoscopy.  ----- Message ----- From: Flossie Buffy, NP Sent: 12/10/2021   5:16 PM EDT To: Lucillie Garfinkel, CMA  hgbA1c at 6.5%: type 2 diabetes Are you ok with nutritionist referral?

## 2021-12-14 ENCOUNTER — Encounter: Payer: Self-pay | Admitting: Nurse Practitioner

## 2021-12-23 ENCOUNTER — Encounter: Payer: Self-pay | Admitting: Obstetrics & Gynecology

## 2021-12-23 ENCOUNTER — Ambulatory Visit: Payer: 59 | Admitting: Obstetrics & Gynecology

## 2021-12-23 VITALS — BP 132/82 | HR 79 | Wt 220.0 lb

## 2021-12-23 DIAGNOSIS — Z9889 Other specified postprocedural states: Secondary | ICD-10-CM

## 2021-12-23 NOTE — Progress Notes (Signed)
History:  56 y.o. [id number] here today for follow up from [facility] with salpingectomy on 07-07-2021. Pt denies any new GYN problems . She denies bleeding or pain. She has a prev abnormal mammogram that rec return to routine screening.    The following portions of the patient's history were reviewed and updated as appropriate: allergies, current medications, past family history, past medical history, past social history, past surgical history and problem list.  Review of Systems:  Pertinent items are noted in HPI.    Objective:  Physical Exam Blood pressure 132/82, pulse 79, weight 220 lb (99.8 kg), last menstrual period 05/04/2018.  CONSTITUTIONAL: Well-developed, well-nourished female in no acute distress.  HENT:  Normocephalic, atraumatic EYES: Conjunctivae and EOM are normal. No scleral icterus.  NECK: Normal range of motion SKIN: Skin is warm and dry. No rash noted. Not diaphoretic.No pallor. [place]: Alert and oriented to person, place, and time. Normal coordination. ' Pelvic: not indicated.    Assessment & Plan:  Diagnoses and all orders for this visit:  Post-operative state   Doing well Mammogram in 2/ 2024 Needs /u colonoscopy. Referral made.   Total face-to-face time with patient, review of chart, discussion with consultant and coordination of care was 85mn.    Aylee Littrell L. Harraway-Smith, M.D., FCherlynn June

## 2021-12-23 NOTE — Progress Notes (Signed)
Patient presents for follow up from [facility] with salpingectomy on 07-07-2021. Patient also started BP meds. [facility]

## 2021-12-24 DIAGNOSIS — H5213 Myopia, bilateral: Secondary | ICD-10-CM | POA: Diagnosis not present

## 2021-12-24 DIAGNOSIS — H524 Presbyopia: Secondary | ICD-10-CM | POA: Diagnosis not present

## 2021-12-24 DIAGNOSIS — H52223 Regular astigmatism, bilateral: Secondary | ICD-10-CM | POA: Diagnosis not present

## 2021-12-29 ENCOUNTER — Encounter: Payer: 59 | Attending: Nurse Practitioner | Admitting: Dietician

## 2021-12-29 ENCOUNTER — Encounter: Payer: Self-pay | Admitting: Dietician

## 2021-12-29 DIAGNOSIS — E119 Type 2 diabetes mellitus without complications: Secondary | ICD-10-CM | POA: Insufficient documentation

## 2021-12-29 NOTE — Progress Notes (Signed)
Patient was seen on 12/29/2021 for the first of a series of three diabetes self-management courses [facility]  Patient Education Plan per assessed needs and concerns is to attend three course education program for Diabetes Self Management Education.  A1C was 6.5% on 12/09/2021.  The following learning objectives were met by the patient during this class: Describe diabetes, types of diabetes and pathophysiology State some common risk factors for diabetes Defines the role of glucose and insulin Describe the relationship between diabetes and cardiovascular and other risks State the members of the Healthcare Team States the rationale for glucose monitoring and when to test State their individual [place] the importance of logging glucose readings and how to interpret the readings Identifies A1C target Explain the correlation between A1c and eAG values State symptoms and treatment of high blood glucose and low blood glucose Explain proper technique for glucose testing and identify proper sharps disposal  Handouts given during class include: How to Thrive:  A Guide for Your Journey with Diabetes by the ADA Meal Plan Card and carbohydrate content list Dietary intake form Low Sodium Flavoring Tips Types of Fats Dining Out Label reading Snack list The diabetes portion plate Diabetes Resources A1c to eAG Conversion Chart Blood Glucose Log Diabetes Recommended Care Schedule Support Group Diabetes Success Plan Core Class Satisfaction Survey   Follow-Up Plan: Attend core 2

## 2022-01-05 ENCOUNTER — Encounter: Payer: Self-pay | Admitting: Dietician

## 2022-01-05 ENCOUNTER — Encounter: Payer: 59 | Admitting: Dietician

## 2022-01-05 DIAGNOSIS — E119 Type 2 diabetes mellitus without complications: Secondary | ICD-10-CM

## 2022-01-05 NOTE — Progress Notes (Signed)
Patient was seen on 01/05/2022 for the second of a series of three diabetes self-management courses at [facility]. The following learning objectives were met by the patient during this class:  Describe the role of different macronutrients on glucose Explain how carbohydrates affect blood glucose State what foods contain the most carbohydrates Demonstrate carbohydrate counting Demonstrate how to read Nutrition Facts food label Describe effects of various fats on heart health Describe the importance of good nutrition for health and healthy eating strategies Describe techniques for managing your shopping, cooking and meal planning List strategies to follow meal plan when dining out Describe the effects of alcohol on glucose and how to use it safely  Goals:  Follow Diabetes Meal Plan as instructed  Aim to spread carbs evenly throughout the day  Aim for 3 meals per day and snacks as needed Include lean protein foods to meals/snacks  Monitor glucose levels as instructed by your doctor   Follow-Up Plan: Attend Core 3 Work towards following your personal food plan.

## 2022-01-12 ENCOUNTER — Ambulatory Visit: Payer: 59

## 2022-02-26 ENCOUNTER — Other Ambulatory Visit (HOSPITAL_BASED_OUTPATIENT_CLINIC_OR_DEPARTMENT_OTHER): Payer: Self-pay

## 2022-02-26 ENCOUNTER — Ambulatory Visit (AMBULATORY_SURGERY_CENTER): Payer: Self-pay | Admitting: *Deleted

## 2022-02-26 VITALS — Ht 66.5 in | Wt 223.4 lb

## 2022-02-26 DIAGNOSIS — Z86010 Personal history of colonic polyps: Secondary | ICD-10-CM

## 2022-02-26 MED ORDER — NA SULFATE-K SULFATE-MG SULF 17.5-3.13-1.6 GM/177ML PO SOLN
1.0000 | Freq: Once | ORAL | 0 refills | Status: AC
  Filled 2022-02-26: qty 354, 1d supply, fill #0

## 2022-02-26 NOTE — Progress Notes (Signed)
No egg or soy allergy known to patient  Pt states she was told she was slow to wake up after last colonoscopy. Patient denies ever being told they had issues or difficulty with intubation  No FH of Malignant Hyperthermia Pt is not on diet pills Pt is not on  home 02  Pt is not on blood thinners  Pt denies issues with constipation  Pt encouraged to use to use Singlecare or Goodrx to reduce cost

## 2022-03-10 ENCOUNTER — Other Ambulatory Visit (HOSPITAL_BASED_OUTPATIENT_CLINIC_OR_DEPARTMENT_OTHER): Payer: Self-pay

## 2022-03-11 ENCOUNTER — Other Ambulatory Visit (HOSPITAL_BASED_OUTPATIENT_CLINIC_OR_DEPARTMENT_OTHER): Payer: Self-pay

## 2022-03-11 ENCOUNTER — Encounter: Payer: Self-pay | Admitting: Nurse Practitioner

## 2022-03-11 ENCOUNTER — Ambulatory Visit: Payer: 59 | Admitting: Nurse Practitioner

## 2022-03-11 VITALS — BP 130/84 | HR 61 | Temp 97.4°F | Ht 66.5 in | Wt 223.0 lb

## 2022-03-11 DIAGNOSIS — Z23 Encounter for immunization: Secondary | ICD-10-CM | POA: Diagnosis not present

## 2022-03-11 DIAGNOSIS — E1169 Type 2 diabetes mellitus with other specified complication: Secondary | ICD-10-CM | POA: Diagnosis not present

## 2022-03-11 DIAGNOSIS — E785 Hyperlipidemia, unspecified: Secondary | ICD-10-CM | POA: Diagnosis not present

## 2022-03-11 DIAGNOSIS — E1165 Type 2 diabetes mellitus with hyperglycemia: Secondary | ICD-10-CM | POA: Diagnosis not present

## 2022-03-11 DIAGNOSIS — I10 Essential (primary) hypertension: Secondary | ICD-10-CM | POA: Diagnosis not present

## 2022-03-11 LAB — MICROALBUMIN / CREATININE URINE RATIO
Creatinine,U: 108.3 mg/dL
Microalb Creat Ratio: 1.2 mg/g (ref 0.0–30.0)
Microalb, Ur: 1.3 mg/dL (ref 0.0–1.9)

## 2022-03-11 LAB — POCT GLYCOSYLATED HEMOGLOBIN (HGB A1C)
HbA1c POC (<> result, manual entry): 5.8 % (ref 4.0–5.6)
HbA1c, POC (controlled diabetic range): 5.8 % (ref 0.0–7.0)
HbA1c, POC (prediabetic range): 5.8 % (ref 5.7–6.4)
Hemoglobin A1C: 5.8 % — AB (ref 4.0–5.6)

## 2022-03-11 MED ORDER — ATORVASTATIN CALCIUM 10 MG PO TABS
10.0000 mg | ORAL_TABLET | Freq: Every day | ORAL | 3 refills | Status: AC
  Filled 2022-03-11 – 2022-03-23 (×2): qty 90, 90d supply, fill #0

## 2022-03-11 NOTE — Assessment & Plan Note (Signed)
Has made diet changes with guidance of nutritionist Started walking 3x/week, 1hr each. Repeat hgbA1c today: 5.8%, improved from 6.5% Normal foot exam Completed DM eye exam: report requested. Current use of an ACE agreed to start a statin drug Collect urine microalbumin today F/up in 3-18month

## 2022-03-11 NOTE — Progress Notes (Signed)
Established Patient Visit  Patient: Carmen Porter   DOB: 22-Apr-1966   56 y.o. Female  MRN: [id number] Visit Date: 03/11/2022  Subjective:    Chief Complaint  Patient presents with   Office Visit    Office Visit F/U HTN No concerns Refill Lisinopril   HPI Essential (primary) hypertension BP remains at goal BP Readings from Last 3 Encounters:  03/11/22 130/84  12/23/21 132/82  12/09/21 126/80    Hyperlipidemia associated with type 2 diabetes mellitus (HCC) Elevated LDL with ASCVD risk at 10.6%. She agreed to start atorvastatin 541m at hs Repeat lipid panel in 3-672month(fasting)  Type 2 diabetes mellitus with hyperglycemia, without long-term current use of insulin (HCRoyal CenterHas made diet changes with guidance of nutritionist Started walking 3x/week, 1hr each. Repeat hgbA1c today: 5.8%, improved from 6.5% Normal foot exam Completed DM eye exam: report requested. Current use of an ACE agreed to start a statin drug Collect urine microalbumin today F/up in 3-41m91monthBP Readings from Last 3 Encounters:  03/11/22 130/84  12/23/21 132/82  12/09/21 126/80    Wt Readings from Last 3 Encounters:  03/11/22 223 lb (101.2 kg)  02/26/22 223 lb 6.4 oz (101.3 kg)  12/29/21 223 lb (101.2 kg)    Reviewed medical, surgical, and social history today  Medications: Outpatient Medications Prior to Visit  Medication Sig   lisinopril (ZESTRIL) 10 MG tablet Take 1 tablet (10 mg total) by mouth daily.   Multiple Vitamin (MULTIVITAMIN) capsule Take 1 capsule by mouth daily.   Na Sulfate-K Sulfate-Mg Sulf (SUPREP BOWEL PREP KIT) 17.5-3.13-1.6 GM/177ML SOLN Take 1 kit by mouth once for 1 dose. (Patient not taking: Reported on 03/11/2022)   No facility-administered medications prior to visit.   Reviewed past medical and social history.   ROS per HPI above      Objective:  BP 130/84 (BP Location: Right Arm, Patient Position: Sitting, Cuff Size: Large)   Pulse 61    Temp (!) 97.4 F (36.3 C) (Temporal)   Ht 5' 6.5" (1.689 m)   Wt 223 lb (101.2 kg)   LMP 05/04/2018   SpO2 98%   BMI 35.45 kg/m      Physical Exam Cardiovascular:     Rate and Rhythm: Normal rate.     Pulses: Normal pulses.  Pulmonary:     Effort: Pulmonary effort is normal.  Musculoskeletal:     Right lower leg: No edema.     Left lower leg: No edema.  Neurological:     Mental Status: She is alert and oriented to person, place, and time.  Psychiatric:        Mood and Affect: Mood normal.        Behavior: Behavior normal.        Thought Content: Thought content normal.     Results for orders placed or performed in visit on 03/11/22  POCT glycosylated hemoglobin (Hb A1C)  Result Value Ref Range   Hemoglobin A1C 5.8 (A) 4.0 - 5.6 %   HbA1c POC (<> result, manual entry) 5.8 4.0 - 5.6 %   HbA1c, POC (prediabetic range) 5.8 5.7 - 6.4 %   HbA1c, POC (controlled diabetic range) 5.8 0.0 - 7.0 %      Assessment & Plan:    Problem List Items Addressed This Visit       Cardiovascular and Mediastinum   Essential (primary) hypertension - Primary  BP remains at goal BP Readings from Last 3 Encounters:  03/11/22 130/84  12/23/21 132/82  12/09/21 126/80        Relevant Medications   atorvastatin (LIPITOR) 10 MG tablet     Endocrine   Hyperlipidemia associated with type 2 diabetes mellitus (HCC)    Elevated LDL with ASCVD risk at 10.6%. She agreed to start atorvastatin 50m at hs Repeat lipid panel in 3-667month(fasting)      Relevant Medications   atorvastatin (LIPITOR) 10 MG tablet   Type 2 diabetes mellitus with hyperglycemia, without long-term current use of insulin (HCOhkay [place]   Has made diet changes with guidance of nutritionist Started walking 3x/week, 1hr each. Repeat hgbA1c today: 5.8%, improved from 6.5% Normal foot exam Completed DM eye exam: report requested. Current use of an ACE agreed to start a statin drug Collect urine microalbumin today F/up in  3-57m54month    Relevant Medications   atorvastatin (LIPITOR) 10 MG tablet   Other Relevant Orders   Urine microalbumin-creatinine with uACR   POCT glycosylated hemoglobin (Hb A1C) (Completed)   Other Visit Diagnoses     Need for shingles vaccine       Relevant Orders   Zoster Recombinant (Shingrix ) (Completed)      Return in about 6 months (around 09/10/2022) for CPE (fasting).     ChaWilfred LacyP

## 2022-03-11 NOTE — Assessment & Plan Note (Signed)
Elevated LDL with ASCVD risk at 10.6%. She agreed to start atorvastatin '10mg'$  at hs Repeat lipid panel in 3-34month (fasting)

## 2022-03-11 NOTE — Patient Instructions (Addendum)
Go to lab hgbA1c of 5.8%: no medication needed at this time Sign medical release to get records from opthalmology Keep up the good work

## 2022-03-11 NOTE — Assessment & Plan Note (Signed)
BP remains at goal BP Readings from Last 3 Encounters:  03/11/22 130/84  12/23/21 132/82  12/09/21 126/80

## 2022-03-15 ENCOUNTER — Other Ambulatory Visit (HOSPITAL_COMMUNITY): Payer: Self-pay

## 2022-03-15 ENCOUNTER — Encounter: Payer: Self-pay | Admitting: Gastroenterology

## 2022-03-22 ENCOUNTER — Other Ambulatory Visit (HOSPITAL_BASED_OUTPATIENT_CLINIC_OR_DEPARTMENT_OTHER): Payer: Self-pay

## 2022-03-23 ENCOUNTER — Other Ambulatory Visit (HOSPITAL_BASED_OUTPATIENT_CLINIC_OR_DEPARTMENT_OTHER): Payer: Self-pay

## 2022-03-24 ENCOUNTER — Ambulatory Visit (AMBULATORY_SURGERY_CENTER): Payer: 59 | Admitting: Gastroenterology

## 2022-03-24 ENCOUNTER — Encounter: Payer: Self-pay | Admitting: Gastroenterology

## 2022-03-24 VITALS — BP 129/90 | HR 55 | Temp 97.8°F | Resp 15 | Ht 66.5 in | Wt 223.4 lb

## 2022-03-24 DIAGNOSIS — Z86010 Personal history of colonic polyps: Secondary | ICD-10-CM | POA: Diagnosis not present

## 2022-03-24 DIAGNOSIS — Z1211 Encounter for screening for malignant neoplasm of colon: Secondary | ICD-10-CM | POA: Diagnosis not present

## 2022-03-24 DIAGNOSIS — D123 Benign neoplasm of transverse colon: Secondary | ICD-10-CM

## 2022-03-24 DIAGNOSIS — Z09 Encounter for follow-up examination after completed treatment for conditions other than malignant neoplasm: Secondary | ICD-10-CM | POA: Diagnosis not present

## 2022-03-24 MED ORDER — SODIUM CHLORIDE 0.9 % IV SOLN: 500.0000 mL | INTRAVENOUS | Status: DC

## 2022-03-24 NOTE — Addendum Note (Signed)
Addended by: Lexine Baton E on: 03/24/2022 11:19 AM   Modules accepted: Orders

## 2022-03-24 NOTE — Progress Notes (Signed)
Referring Provider: Flossie Buffy, NP Primary Care Physician:  Flossie Buffy, NP  Indication for Procedure:  Colon cancer Surveillance   IMPRESSION:  Need for colon cancer surveillance Appropriate candidate for monitored anesthesia care  PLAN: Colonoscopy in the [facility] today   HPI: Carmen Porter is a 56 y.o. female presents for surveillance colonoscopy.  Prior endoscopic history: Colonoscopy 04/17/2018 - Hemorrhoids found on perianal exam. - One 12 mm polyp in the proximal descending colon, removed with a hot snare. Resected and retrieved. - One 3 mm polyp in the descending colon, removed with a cold snare. Resected and retrieved. - One 4 mm polyp in the rectum, removed with a cold snare. Resected and retrieved. - The examination was otherwise normal on direct and retroflexion views.  Pathology showed two tubular adenomas and a rectal hyperplastic polyp.   No baseline GI symptoms.   No known family history of colon cancer or polyps. No family history of uterine/endometrial cancer, pancreatic cancer or gastric/stomach cancer.   Past Medical History:  Diagnosis Date   Anemia 2019   hx of anemia due to very heavy periods, pt received iron infusions, pt had an endometrial ablation in 2020   Arthritis    right knee   Blood transfusion without reported diagnosis 2012   due to heavy menstrual periods   Complication of anesthesia    Complication from anesthesia was already marked as positive in Epic. Per 06/26/21 phone call with pt, the only problem with anesthesia that she has ever had was being slow to wake up from her colonscopy in 2019   Diabetes mellitus without complication ([place])    no medications   Hypertension     Past Surgical History:  Procedure Laterality Date   BREAST BIOPSY Left 07/28/2021   COLONOSCOPY     COLONOSCOPY W/ POLYPECTOMY  04/17/2018   3 polyps removed   DILITATION & CURRETTAGE/HYSTROSCOPY WITH NOVASURE ABLATION N/A 11/07/2018    Procedure: DILATATION & CURETTAGE/HYSTEROSCOPY WITH NOVASURE ABLATION;  Surgeon: Lavonia Drafts, MD;  Location: [facility];  Service: Gynecology;  Laterality: N/A;   TUBAL LIGATION     VAGINAL HYSTERECTOMY Bilateral 07/07/2021   Procedure: HYSTERECTOMY VAGINAL WITH SALPINGECTOMY;  Surgeon: Lavonia Drafts, MD;  Location: [facility];  Service: Gynecology;  Laterality: Bilateral;    Current Outpatient Medications  Medication Sig Dispense Refill   atorvastatin (LIPITOR) 10 MG tablet Take 1 tablet (10 mg total) by mouth at bedtime. 90 tablet 3   lisinopril (ZESTRIL) 10 MG tablet Take 1 tablet (10 mg total) by mouth daily. 90 tablet 3   Multiple Vitamin (MULTIVITAMIN) capsule Take 1 capsule by mouth daily.     Current Facility-Administered Medications  Medication Dose Route Frequency Provider Last Rate Last Admin   0.9 %  sodium chloride infusion  500 mL Intravenous Continuous Thornton Park, MD        Allergies as of 03/24/2022   (No Known Allergies)    Family History  Problem Relation Age of Onset   Stroke Father    Hypertension Father    Heart failure Father    Heart disease Father    Breast cancer Maternal Aunt    Cancer Maternal Grandmother        cervical cancer   Cancer Maternal Grandfather        prostate cancer   Glaucoma Maternal Grandfather    Colon cancer Neg Hx    Esophageal cancer Neg Hx    Rectal cancer Neg Hx  Stomach cancer Neg Hx      Physical Exam: General:   Alert,  well-nourished, pleasant and cooperative in NAD Head:  Normocephalic and atraumatic. Eyes:  Sclera clear, no icterus.   Conjunctiva pink. Mouth:  No deformity or lesions.   Neck:  Supple; no masses or thyromegaly. Lungs:  Clear throughout to auscultation.   No wheezes. Heart:  Regular rate and rhythm; no murmurs. Abdomen:  Soft, non-tender, nondistended, normal bowel sounds, no rebound or guarding.  Msk:  Symmetrical. No boney deformities LAD: No inguinal or  umbilical LAD Extremities:  No clubbing or edema. Neurologic:  Alert and  oriented x4;  grossly nonfocal Skin:  No obvious rash or bruise. Psych:  Alert and cooperative. Normal mood and affect.     Studies/Results: No results found.    Carmen Ganser L. Tarri Glenn, MD, MPH 03/24/2022, 9:59 AM

## 2022-03-24 NOTE — Progress Notes (Signed)
Pt's states no medical or surgical changes since previsit or office visit. 

## 2022-03-24 NOTE — Patient Instructions (Signed)
Handout on polyps given to you today  Await pathology results   YOU HAD AN ENDOSCOPIC PROCEDURE TODAY AT THE [facility]:   Refer to the procedure report that was given to you for any specific questions about what was found during the examination.  If the procedure report does not answer your questions, please call your gastroenterologist to clarify.  If you requested that your care partner not be given the details of your procedure findings, then the procedure report has been included in a sealed envelope for you to review at your convenience later.  YOU SHOULD EXPECT: Some feelings of bloating in the abdomen. Passage of more gas than usual.  Walking can help get rid of the air that was put into your GI tract during the procedure and reduce the bloating. If you had a lower endoscopy (such as a colonoscopy or flexible sigmoidoscopy) you may notice spotting of blood in your stool or on the toilet paper. If you underwent a bowel prep for your procedure, you may not have a normal bowel movement for a few days.  Please Note:  You might notice some irritation and congestion in your nose or some drainage.  This is from the oxygen used during your procedure.  There is no need for concern and it should clear up in a day or so.  SYMPTOMS TO REPORT IMMEDIATELY:  Following lower endoscopy (colonoscopy or flexible sigmoidoscopy):  Excessive amounts of blood in the stool  Significant tenderness or worsening of abdominal pains  Swelling of the abdomen that is new, acute  Fever of 100F or higher  For urgent or emergent issues, a gastroenterologist can be reached at any hour by calling [phone]. Do not use MyChart messaging for urgent concerns.    DIET:  We do recommend a small meal at first, but then you may proceed to your regular diet.  Drink plenty of fluids but you should avoid alcoholic beverages for 24 hours.  ACTIVITY:  You should plan to take it easy for the rest of today and you  should NOT DRIVE or use heavy machinery until tomorrow (because of the sedation medicines used during the test).    FOLLOW UP: Our staff will call the number listed on your records the next business day following your procedure.  We will call around 7:15- 8:00 am to check on you and address any questions or concerns that you may have regarding the information given to you following your procedure. If we do not reach you, we will leave a message.     If any biopsies were taken you will be contacted by phone or by letter within the next 1-3 weeks.  Please call us at [phone] if you have not heard about the biopsies in 3 weeks.    SIGNATURES/CONFIDENTIALITY: You and/or your care partner have signed paperwork which will be entered into your electronic medical record.  These signatures attest to the fact that that the information above on your After Visit Summary has been reviewed and is understood.  Full responsibility of the confidentiality of this discharge information lies with you and/or your care-partner. 

## 2022-03-24 NOTE — Op Note (Signed)
[facility] Patient Name: Carmen Porter Procedure Date: 03/24/2022 10:04 AM MRN: [id number] Endoscopist: Thornton Park MD, MD, [id number] Age: 56 Referring MD:  Date of Birth: 07-30-1965 Gender: Female Account #: [id number] Procedure:                Colonoscopy Indications:              High risk colon cancer surveillance: Personal                            history of adenoma (10 mm or greater in size)                           Colonoscopy 04/17/2018: 39m descending colon                            tubular adenoma, 311mdescending colon tubular                            adenoma, 70m71mectal hyperplastic polyp Medicines:                Monitored Anesthesia Care Procedure:                Pre-Anesthesia Assessment:                           - Prior to the procedure, a History and Physical                            was performed, and patient medications and                            allergies were reviewed. The patient's tolerance of                            previous anesthesia was also reviewed. The risks                            and benefits of the procedure and the sedation                            options and risks were discussed with the patient.                            All questions were answered, and informed consent                            was obtained. Prior Anticoagulants: The patient has                            taken no anticoagulant or antiplatelet agents. ASA                            Grade Assessment: II - A patient with mild systemic  disease. After reviewing the risks and benefits,                            the patient was deemed in satisfactory condition to                            undergo the procedure.                           After obtaining informed consent, the colonoscope                            was passed under direct vision. Throughout the                            procedure, the patient's blood  pressure, pulse, and                            oxygen saturations were monitored continuously. The                            CF [id number] #[id number] was introduced through the anus                            and advanced to the 3 cm into the ileum. A second                            forward view of the right colon was performed. The                            colonoscopy was performed without difficulty. The                            patient tolerated the procedure well. The quality                            of the bowel preparation was good. The terminal                            ileum, ileocecal valve, appendiceal orifice, and                            rectum were photographed. Scope In: 10:09:55 AM Scope Out: 10:33:06 AM Scope Withdrawal Time: 0 hours 14 minutes 12 seconds  Total Procedure Duration: 0 hours 23 minutes 11 seconds  Findings:                 Non-bleeding internal hemorrhoids were found.                           A 2 mm polyp was found in the transverse colon. The                            polyp was sessile. The polyp was  removed with a                            cold snare. Resection and retrieval were complete.                            Estimated blood loss was minimal.                           A 2 mm polyp was found in the transverse colon. The                            polyp was flat. The polyp was removed with a cold                            snare. Resection and retrieval were complete.                            Estimated blood loss was minimal.                           The exam was otherwise without abnormality on                            direct and retroflexion views. Complications:            No immediate complications. Estimated Blood Loss:     Estimated blood loss was minimal. Impression:               - Non-bleeding internal hemorrhoids.                           - One 2 mm polyp in the transverse colon, removed                            with a  cold snare. Resected and retrieved.                           - One 2 mm polyp in the transverse colon, removed                            with a cold snare. Resected and retrieved.                           - The examination was otherwise normal on direct                            and retroflexion views. Recommendation:           - Patient has a contact number available for                            emergencies. The signs and symptoms of potential  delayed complications were discussed with the                            patient. Return to normal activities tomorrow.                            Written discharge instructions were provided to the                            patient.                           - Resume previous diet.                           - Continue present medications.                           - Await pathology results.                           - Repeat colonoscopy in 5 years for surveillance                            regardless of pathology results given the history                            of polyps.                           - Emerging evidence supports eating a diet of                            fruits, vegetables, grains, calcium, and yogurt                            while reducing red meat and alcohol may reduce the                            risk of colon cancer.                           - Thank you for allowing me to be involved in your                            colon cancer prevention. Thornton Park MD, MD 03/24/2022 10:38:22 AM This report has been signed electronically.

## 2022-03-24 NOTE — Progress Notes (Signed)
To PACU, VSS. Report to [email or web address] 

## 2022-03-25 ENCOUNTER — Telehealth: Payer: Self-pay

## 2022-03-25 NOTE — Telephone Encounter (Signed)
  Follow up Call-     03/24/2022    8:53 AM  Call back number  Post procedure Call Back phone  # [phone]  Permission to leave phone message Yes     Patient questions:  Do you have a fever, pain , or abdominal swelling? No. Pain Score  0 *  Have you tolerated food without any problems? Yes.    Have you been able to return to your normal activities? Yes.    Do you have any questions about your discharge instructions: Diet   No. Medications  No. Follow up visit  No.  Do you have questions or concerns about your Care? No.  Actions: * If pain score is 4 or above: No action needed, pain <4.

## 2022-04-02 ENCOUNTER — Encounter: Payer: Self-pay | Admitting: Gastroenterology

## 2022-04-13 ENCOUNTER — Encounter: Payer: Self-pay | Admitting: General Practice

## 2022-05-13 ENCOUNTER — Encounter: Payer: Self-pay | Admitting: Family

## 2022-05-15 ENCOUNTER — Encounter: Payer: Self-pay | Admitting: Family

## 2022-11-03 ENCOUNTER — Encounter: Payer: Self-pay | Admitting: Family

## 2023-02-09 ENCOUNTER — Other Ambulatory Visit: Payer: Self-pay

## 2023-02-16 ENCOUNTER — Other Ambulatory Visit (HOSPITAL_COMMUNITY): Payer: Self-pay

## 2023-03-30 ENCOUNTER — Other Ambulatory Visit (HOSPITAL_BASED_OUTPATIENT_CLINIC_OR_DEPARTMENT_OTHER): Payer: Self-pay

## 2023-03-30 ENCOUNTER — Encounter: Payer: Self-pay | Admitting: Family

## 2023-03-30 MED ORDER — INFLUENZA VIRUS VACC SPLIT PF (FLUZONE) 0.5 ML IM SUSY
0.5000 mL | PREFILLED_SYRINGE | Freq: Once | INTRAMUSCULAR | 0 refills | Status: AC
  Filled 2023-03-30: qty 0.5, 1d supply, fill #0

## 2023-09-13 ENCOUNTER — Telehealth: Payer: Self-pay | Admitting: Nurse Practitioner

## 2023-09-13 NOTE — Telephone Encounter (Signed)
 [email or web address] if appt is needed.

## 2024-07-03 ENCOUNTER — Encounter: Payer: Self-pay | Admitting: Family
